# Patient Record
Sex: Male | Born: 1960 | Race: White | Hispanic: No | Marital: Married | Smoking: Former smoker
Health system: Southern US, Community
[De-identification: ages and names within clinical notes are randomized; demographics above are authoritative.]

## PROBLEM LIST (undated history)

## (undated) DIAGNOSIS — I429 Cardiomyopathy, unspecified: Secondary | ICD-10-CM

## (undated) DIAGNOSIS — K219 Gastro-esophageal reflux disease without esophagitis: Secondary | ICD-10-CM

## (undated) DIAGNOSIS — N289 Disorder of kidney and ureter, unspecified: Secondary | ICD-10-CM

## (undated) DIAGNOSIS — E669 Obesity, unspecified: Secondary | ICD-10-CM

## (undated) DIAGNOSIS — I4892 Unspecified atrial flutter: Secondary | ICD-10-CM

## (undated) HISTORY — PX: FRACTURE SURGERY: SHX138

## (undated) HISTORY — PX: TONSILLECTOMY: SUR1361

## (undated) HISTORY — PX: APPENDECTOMY: SHX54

## (undated) HISTORY — PX: ANKLE FRACTURE SURGERY: SHX122

---

## 1999-03-12 ENCOUNTER — Emergency Department (HOSPITAL_COMMUNITY): Admission: EM | Admit: 1999-03-12 | Discharge: 1999-03-12 | Payer: Self-pay | Admitting: Emergency Medicine

## 2007-04-20 ENCOUNTER — Emergency Department (HOSPITAL_COMMUNITY): Admission: EM | Admit: 2007-04-20 | Discharge: 2007-04-20 | Payer: Self-pay | Admitting: Emergency Medicine

## 2007-05-03 ENCOUNTER — Ambulatory Visit (HOSPITAL_BASED_OUTPATIENT_CLINIC_OR_DEPARTMENT_OTHER): Admission: RE | Admit: 2007-05-03 | Discharge: 2007-05-03 | Payer: Self-pay | Admitting: Orthopedic Surgery

## 2007-07-29 ENCOUNTER — Encounter: Admission: RE | Admit: 2007-07-29 | Discharge: 2007-10-12 | Payer: Self-pay | Admitting: Orthopedic Surgery

## 2008-04-10 IMAGING — CT CT EXTREM LOW W/O CM*R*
1 of 2 series · 1 of 14 positions shown, 2 images · non-contrast
Comparison: Right ankle x-rays earlier in the evening.

CLINICAL DATA: Right ankle fracture.

CT OF THE RIGHT LOWER EXTREMITY (ANKLE) -WITHOUT CONTRAST 04/20/2007:
TECHNIQUE: Multidetector CT imaging was performed according to the standard
protocol.  Sagittal and coronal plane reformatted images were reconstructed from
the axial CT data, and were also reviewed.

[Series 2: control scan 2.0 b60s · axial · 0.24mm/px · z∈[-172,-172]mm · 1 of 1 slices shown, 2 images]
[im 1/1  soft-tissue]
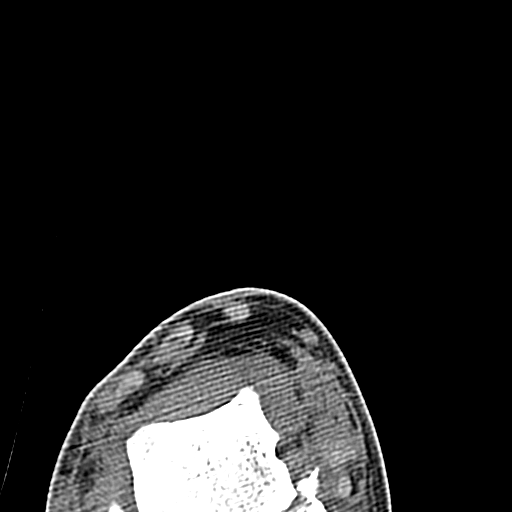
[im 1/1  bone]
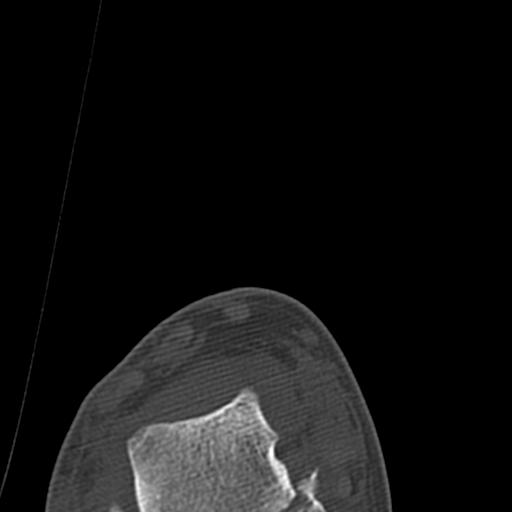

[1 of 14 positions shown; findings below may reference images not displayed]

FINDINGS: Comminuted fracture involving the posteromedial aspect of the talus
into multiple parts. Approximately 9 mm of distraction of the major posterior
fracture fragment from the remainder of the talus. Slight depression of the
posterolateral talar articular surface. No evidence of calcaneal fracture as
questioned on the plain films. Small plantar calcaneal spur noted. No fractures
involving the distal tibia or fibula. Tarsal navicular and cuboid bone also no
the intact without evidence of fracture.
IMPRESSION: Comminuted, multi-part fracture involving the posteromedial talus, with slight
depression of the posteromedial talar articular surface. No other fractures
about the ankle.

## 2008-04-10 IMAGING — CR DG ANKLE COMPLETE 3+V*R*
3 series · 3 of 3 positions shown · non-contrast
Comparison: none

CLINICAL DATA: 46 year-old male, right ankle injury. Pain and swelling.
RIGHT ANKLE ? 3 VIEW:

[t ankle joint ap right]
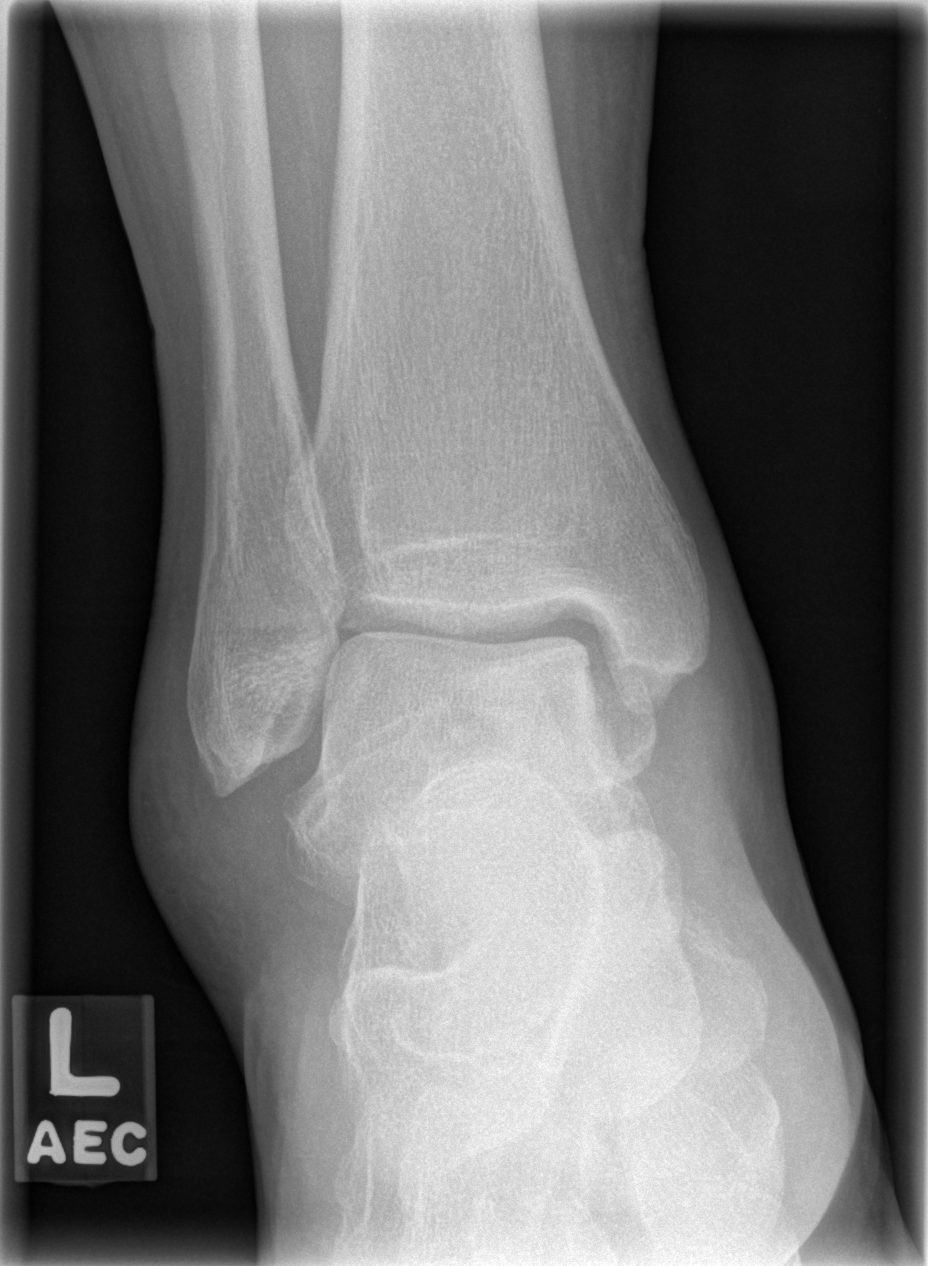

[t ankle joint oblique right]
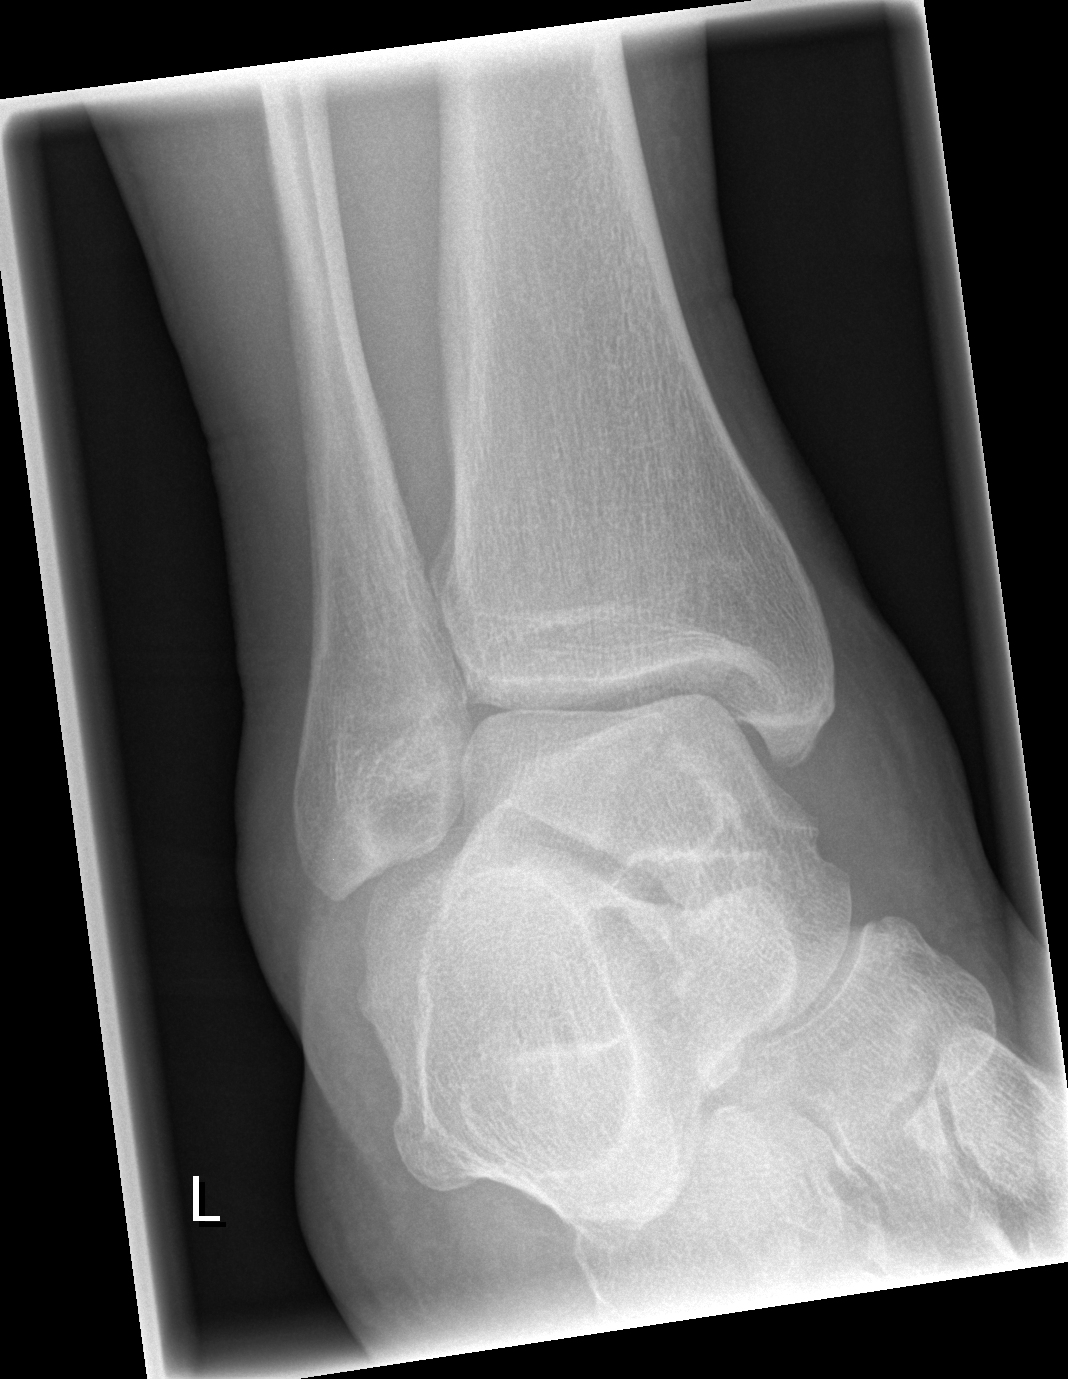

[t ankle joint lat right]
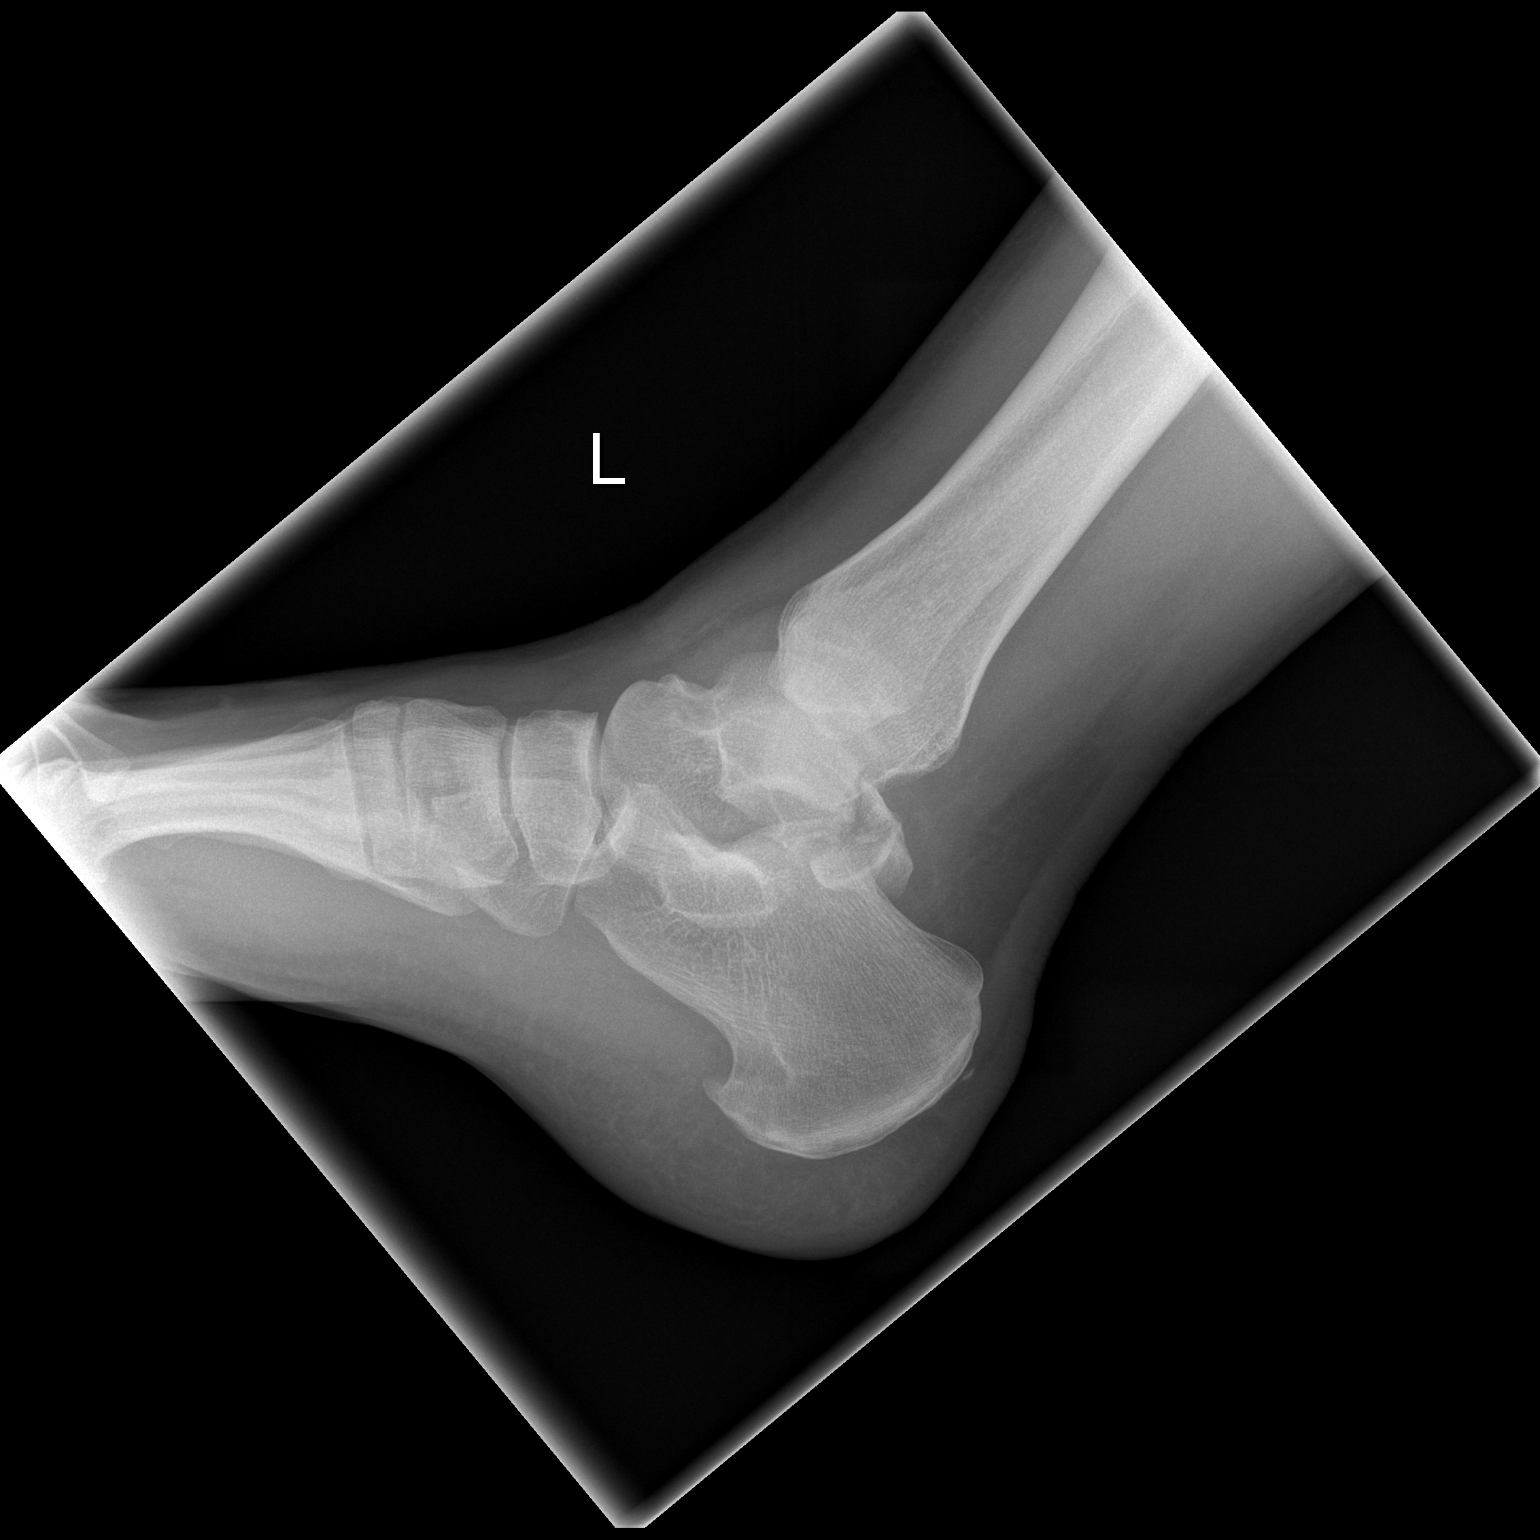

[3 of 3 positions shown; findings below may reference images not displayed]

FINDINGS: There is soft tissue swelling overlying the lateral malleolus without underlying fibular fracture. Lateral view demonstrates a comminuted posterior talar fracture. Joint effusion is present.
IMPRESSION: 1.  Comminuted posterior fracture of the talus. There may be a fracture of the calcaneus along the talocalcaneal joint.
2.  Moderate size joint effusion. 
The findings were called to Dr. Eduardo Faria.

## 2011-04-14 NOTE — Op Note (Signed)
NAME:  Dean Graves, Dean Graves NO.:  [id number]   MEDICAL RECORD NO.:  [id number]          PATIENT TYPE:  AMB   LOCATION:  DSC                          FACILITY:  [facility]   PHYSICIAN:  Leonides Grills, M.D.     DATE OF BIRTH:  [birth year]   DATE OF PROCEDURE:  05/03/2007  DATE OF DISCHARGE:                               OPERATIVE REPORT   PREOPERATIVE DIAGNOSIS:  Right talar body fracture.   POSTOPERATIVE DIAGNOSIS:  Right talar body fracture.   OPERATION:  1. Open reduction internal fixation, right talus fracture.  2. Right tarsal tunnel release.  3. Stress x-rays right ankle.   ANESTHESIA:  General.   SURGEON:  Leonides Grills,  MD   ASSISTANT:  Evlyn Kanner, P.A.   ESTIMATED BLOOD LOSS:  Minimal.   TOURNIQUET TIME:  Approximately an hour and a half.   COMPLICATIONS:  None.   DISPOSITION:  Stable to PR.   INDICATIONS:  A 50 year old male who slipped and fell off a ladder and  sustained the above injury.  He was consented for the above procedure.  All risks which include infection, nerve or vessel injury, nonunion,  malunion, hardware irritation, hardware failure, stiffness, arthritis,  persistent pain, worse pain, prolonged recovery, numbness of the plantar  aspect of his foot, specifically and vessel injury from the approach,  burning pain were all explained.  Questions were encouraged and  answered.   OPERATION:  The patient brought to the operating room, placed in supine  position initially.  After adequate general endotracheal tube anesthesia  administered as well as Ancef 1 gram IV piggyback the patient was then  placed in the later decubitus position with the operative side down on a  beanbag.  All bony prominences well padded, axillary roll was placed.  Right lower extremity was prepped and draped in sterile manner over a  proximally thigh tourniquet.  Limb was gravity exsanguinated.  Tourniquet elevated 290 mmHg.  Posterior medial approach to the  ankle  was then performed with a curvilinear incision.  Dissection was carried  down through skin.  Hemostasis was obtained.  A formal tarsal tunnel  release was then performed.  Once the flexor retinaculum over the tarsal  tunnel was released, the nerve and vessel were identified.  These were  retracted posteriorly out of harm's way.  FDL tendon was then identified  within the sheath and traced distally and again this was retracted with  the neurovascular structures as well.  FHL tendon was identified, traced  to the talus fracture posterior fracture fragment.  This was also a  tenolysis was performed of this tendon and its tendon tunnel was also  released as well.  Once both tunnels were released respectively for the  FDL and FHL, this helped [place] mobilize neurovascular structures and  protect neurovascular structures against these two tendons.  We were  able to visualize the fracture beautifully at this point.  There was a  lot of comminution in this area as well as several different  osteochondral lesions.  One from the subtalar area that needed to be  removed  was a free fragment and had no subchondral bone.  We used a  lamina spreader to book open the fracture and were able to debride any  loose fragments within the subtalar joint and ankle joint respectively.  We then anatomically reduced the fracture and then made a a small  incision just lateral to the Achilles tendon and blunt dissected to the  posterior aspect of the talus.  We then placed a threaded K-wire  provisionally in this area to hold the fracture reduced.  We then placed  another threaded K-wire protecting the neurovascular structures and  placed this medially as well.  We then placed two 3.5 mm partially  threaded cannulated screws over the wire drilling this in a lag fashion  with 3.5, 2.7 mm drill holes respectively.  This had excellent purchase  and compression of the fracture site.  Final stress x-rays were obtained   in the AP ankle, lateral ankle and the AP talar views and showed  anatomic reduction in all planes, fixation in proper position and  excellent as well.  We copiously irrigated the area with normal saline,  range-of-motion the ankle and talar joint without any impingement  mechanically.  It had excellent motion.  Tourniquet inflated.  Hemostasis was obtained.  There is no pulsatile bleeding and there was  excellent hemostasis.  Subcu was closed with 2-0 Vicryl.  Skin was  closed with 4-0 nylon over all wounds.  Sterile dressings were applied.  Modified Jones dressing was applied with ankle in neutral dorsiflexion.  The patient was stable to PR.   POSTOP COURSE:  The patient will follow-up in 2 weeks. At that time we  will remove the dressing as well as suture.  Will then place him in a  Cam walker boot nonweightbearing and start him on range of motion excise  for active passive range of motion excise the ankle and talar joint.  He  will remain nonweightbearing for total of 8 weeks and weight bear as  tolerated in the cam walker boot for additional month.  At 3 months  postop will likely have him go into a normal shoe.  Elevation active  range of motion toes were encouraged.      Leonides Grills, M.D.  Electronically Signed     PB/MEDQ  D:  05/03/2007  T:  05/03/2007  Job:  [id number]

## 2011-04-14 NOTE — Consult Note (Signed)
NAME:  CAUY, MELODY NO.:  [id number]   MEDICAL RECORD NO.:  [id number]          PATIENT TYPE:  EMS   LOCATION:  ED                           FACILITY:  [facility]   PHYSICIAN:  Erasmo Leventhal, M.D.DATE OF BIRTH:  Apr 09, 1961   DATE OF CONSULTATION:  04/20/2007  DATE OF DISCHARGE:  04/20/2007                                 CONSULTATION   TIME:  7:45 p.m.   HISTORY OF PRESENT ILLNESS:  Mr. Troung is a 50 year old male who fell  approximately 8 to 9 feet today off a ladder and sustained left ankle  pain.  He denies any problems with his back or right lower extremity.  He only complains of left foot and ankle pain.  He works as a Music therapist.  He presented to [facility] ED, was evaluated by the ED physician there  and diagnosed having a left talus fracture.  I was consulted and asked  to evaluate for CT scan done.   ALLERGIES:  SOME ANTIBIOTIC, he is not sure of the type.   MEDICATIONS:  None.   PAST MEDICAL HISTORY:  Borderline hypertension.   PAST SURGICAL HISTORY:  None.   FAMILY HISTORY:  Noncontributory.   SOCIAL HISTORY:  He is married.  No tobacco, no alcohol.   REVIEW OF SYSTEMS:  Musculoskeletal system otherwise negative.   PHYSICAL EXAMINATION:  A very pleasant gentleman accompanied by his  wife.  Upper extremities other than right shoulder slightly sore but  full range of motion and normal strength.  Left lower extremity  unremarkable.  Right lower extremity:  Hip and knee negative.  Foot and  ankle mild swelling.  Skin is intact.  Neurovascular examination is  intact.  Compartments are soft.  Pain with range of motion, active and  passive.   Plain x-rays reviewed and these reveal what appears to be a posterior  talus fracture possibly associated calcaneus fracture.  CT scan was very  revealing with a comminuted, displaced, posterior talus fracture.   IMPRESSION:  1. Comminuted posterior talus fracture on the right.  2. Strained right  shoulder.   RECOMMENDATIONS:  We have splinted him.  He is going home, elevate,  neurovascular checks, bathroom privileges only, touchdown weightbearing.  I will also discuss this with Dr. Lestine Box.  He has a copy of his CT scan  and x-rays.  I will discuss this with Dr. Lestine Box of the foot and ankle,  who is subsequently a specialist, for further definitive treatment.  All  questions encouraged and answered from the patient and his wife.           ______________________________  Erasmo Leventhal, M.D.     RAC/MEDQ  D:  04/20/2007  T:  04/21/2007  Job:  [id number]

## 2011-05-23 ENCOUNTER — Ambulatory Visit (HOSPITAL_COMMUNITY): Admission: EM | Admit: 2011-05-23 | Payer: Self-pay | Source: Ambulatory Visit | Admitting: Orthopedic Surgery

## 2011-08-04 ENCOUNTER — Emergency Department (HOSPITAL_COMMUNITY)
Admission: EM | Admit: 2011-08-04 | Discharge: 2011-08-04 | Disposition: A | Discharge status: Home / Self Care | Payer: Worker's Compensation | Attending: Emergency Medicine | Admitting: Emergency Medicine

## 2011-08-04 DIAGNOSIS — S60559A Superficial foreign body of unspecified hand, initial encounter: Secondary | ICD-10-CM | POA: Insufficient documentation

## 2011-08-04 DIAGNOSIS — W268XXA Contact with other sharp object(s), not elsewhere classified, initial encounter: Secondary | ICD-10-CM | POA: Insufficient documentation

## 2011-08-07 ENCOUNTER — Ambulatory Visit (HOSPITAL_COMMUNITY)
Admission: AD | Admit: 2011-08-07 | Discharge: 2011-08-07 | Disposition: A | Discharge status: Home / Self Care | Payer: Worker's Compensation | Source: Ambulatory Visit | Attending: Orthopedic Surgery | Admitting: Orthopedic Surgery

## 2011-08-07 DIAGNOSIS — X58XXXA Exposure to other specified factors, initial encounter: Secondary | ICD-10-CM | POA: Insufficient documentation

## 2011-08-07 DIAGNOSIS — Y999 Unspecified external cause status: Secondary | ICD-10-CM | POA: Insufficient documentation

## 2011-08-07 DIAGNOSIS — Y929 Unspecified place or not applicable: Secondary | ICD-10-CM | POA: Insufficient documentation

## 2011-08-07 DIAGNOSIS — S60559A Superficial foreign body of unspecified hand, initial encounter: Secondary | ICD-10-CM | POA: Insufficient documentation

## 2011-08-07 LAB — SURGICAL PCR SCREEN
MRSA, PCR: NEGATIVE
Staphylococcus aureus: NEGATIVE

## 2011-08-07 LAB — CBC
Hemoglobin: 14.3 g/dL (ref 13.0–17.0)
MCHC: 35.5 g/dL (ref 30.0–36.0)
RBC: 4.7 MIL/uL (ref 4.22–5.81)
WBC: 6.5 10*3/uL (ref 4.0–10.5)

## 2011-08-09 NOTE — Op Note (Signed)
NAME:  Dean Graves, Dean Graves NO.:  [id number]  MEDICAL RECORD NO.:  [id number]  LOCATION:  2852                         FACILITY:  [facility]  PHYSICIAN:  Betha Loa, MD        DATE OF BIRTH:  09/01/1961  DATE OF PROCEDURE:  08/07/2011 DATE OF DISCHARGE:                              OPERATIVE REPORT   PREOPERATIVE DIAGNOSIS:  Left hand foreign body.  POSTOPERATIVE DIAGNOSIS:  Left hand foreign body.  PROCEDURE:  Left hand irrigation, debridement, and removal of foreign body.  SURGEON:  Betha Loa, MD  ASSISTANCE:  None.  ANESTHESIA:  General.  IV FLUIDS:  Per anesthesia flow sheet.  ESTIMATED BLOOD LOSS:  Minimal.  COMPLICATIONS:  None.  SPECIMENS:  Cultures to micro.  TOURNIQUET TIME:  31 minutes.  DISPOSITION:  Stable to PACU.  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  He states 3 days ago he got a wood splinter into his left palm.  He was seen in an urgent care in the emergency department but no more of the splinter was able to be removed than what he could pull out initially.  He followed up with me in the office.  He had intact sensation and capillary refill in all fingertips.  He could flex and extend the IP joint of thumb.  FDP and FDS were intact to all fingers.  He felt that there was foreign body remaining in the hand.  On radiographs, a small amount of radiopaque foreign body could be seen.  I discussed to Mr. Doyon the nature of this condition.  We discussed going to the operating room for irrigation and debridement and removal of the foreign body.  Risks, benefits and alternatives of surgery were discussed including the risk of blood loss, infection, damage to nerves, vessels, tendons, ligaments, bone, failure of surgery, need for additional surgery, complications with wound healing, continued pain, retained foreign body, need for repeat irrigation and debridement, and removal of foreign body.   He voiced understanding of these risks and elected to proceed.  OPERATIVE COURSE:  After being identified preoperatively by myself, the patient and I agreed upon the procedure and site procedure.  The surgical site was marked.  The risks, benefits and alternatives of surgery were reviewed and wished to proceed.  Surgical consent has been signed.  He was transferred to the operating room and placed on the operating room table in supine position with left upper extremity on arm board. General anesthesia was induced by the anesthesiologist.  Left upper extremity was prepped and draped in normal sterile orthopedic fashion. A surgical pause was performed between surgeons, Anesthesia and operating room staff, and all were in agreement as to the patient, procedure, and site of procedure.  Tourniquet at the proximal aspect of the forearm was inflated 250 mmHg after examination of limb with an Esmarch bandage.  The wound was extended in a Brunner-type fashion proximally from the volar aspect of the index MP joint.  This was carried into subcutaneous tissues by spreading technique.  Care was taken to protect all neurovascular structures.  The foreign body was identified.  It was splintered at  its proximal portion.  It was freed up from the soft tissues and was able to be removed in its entirety.  The void was in the subcutaneous tissues volar to the tendons and neurovascular bundles.  The ulnar digital nerve to the index finger was identified and was intact.  It was deep to the foreign body.  The wound was copiously irrigated with 1000 mL of sterile saline.  A vessel loop drain and iodoform packing were placed in the wound.  The wound was left open for drainage.  Cultures had been taken after removal of the foreign body. The wound was injected with approximately 6 mL of 0.25% plain Marcaine to aid in postoperative analgesia.  The wound was then dressed with sterile Xeroform and 4 x 4s and wrapped  with Kerlix and a Coban dressing lightly.  Tourniquets was deflated to 31 minutes.  The operative drapes were broken down.  The fingertips were pink with brisk capillary refill after deflation of the tourniquet.  The patient was awoken from anesthesia safely.  He was transferred back to stretcher and taken to PACU in stable condition.  He will be discharged home tonight.  I will give him a prescription for Percocet 5/325 one to two p.o. q.6 h. p.r.n. pain dispense #40 and Bactrim DS 1 p.o. b.i.d. x7 days.     Betha Loa, MD     KK/MEDQ  D:  08/07/2011  T:  08/08/2011  Job:  [id number]  Electronically Signed by Betha Loa  on 08/09/2011 05:03:43 PM

## 2011-08-09 NOTE — H&P (Signed)
NAME:  Dean Graves, BOHLKEN NO.:  [id number]  MEDICAL RECORD NO.:  [id number]  LOCATION:  2852                         FACILITY:  [facility]  PHYSICIAN:  Betha Loa, MD        DATE OF BIRTH:  1961/03/24  DATE OF ADMISSION:  08/07/2011 DATE OF DISCHARGE:                             HISTORY & PHYSICAL   CHIEF COMPLAINT:  Left hand splinter.  HISTORY OF PRESENT ILLNESS:  [name] is a 50 year old male who 3 days ago was moving some wood and got a splinter in his left hand in the palm.  He went to Urgent Care and Emergency Department and a part of the splinter was able to be removed.  He feels there are some left.  He was placed on clindamycin.  He has had no fevers, chills, or night sweats. He followed up with me in the office.  ALLERGIES:  PENICILLIN causes hives.  PAST MEDICAL HISTORY:  None.  PAST SURGICAL HISTORY:  Left index, long, and ring fingertip table saw injury, appendectomy, and right ankle open reduction and internal fixation.  MEDICATIONS:  None.  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  He does not smoke and drinks alcohol occasionally.  FAMILY HISTORY:  Positive for heart disease, hypertension, and arthritis.  REVIEW OF SYSTEMS:  Thirteen-point review of systems is negative with the exception of eyeglasses.  PHYSICAL EXAMINATION:  GENERAL:  Alert and oriented x3, well developed, well nourished. HEAD:  Normocephalic, atraumatic. NECK:  Supple, full range of motion. CHEST:  Regular rhythm. LUNGS:  Clear to auscultation bilaterally. ABDOMEN:  Nontender, nondistended. EXTREMITIES:  Bilateral upper extremities are intact to light touch sensation and capillary refill in all fingertips.  He can flex and extend the IP joints of his thumbs and can cross his fingers.  The right upper extremity is  without wounds, without tenderness to palpation. Left upper extremity has a wound volar to the MP of the index finger on the left palm.  He  states he feels like there is something underneath the skin over the MP joint of the index and long finger.  There is mild swelling and reddening in this area.  FDP and FDS are intact to all fingers.  He has intact sensation and capillary refill in all fingertips.  RADIOGRAPHS:  AP, lateral, and oblique views of the hand show evidence of a potential foreign body, volar to the MP joints, in the index, long, and possibly ring finger.  No fractures or dislocations are noted.  ASSESSMENT AND PLAN:  Left hand retained foreign body.  I discussed with Mr. Fana the nature of this condition.  We discussed going to the operating room for irrigation and debridement of the wound and removal of the foreign body.  Risks, benefits, and alternative of surgery were discussed including the risks of blood loss, infection, damage to nerves, vessels, tendons, ligaments, and bone, failure of surgery, need for additional surgery, complications with wound healing, continued pain, continued infection, and retained foreign body.  He voiced understanding these risks and elected to proceed.     Betha Loa, MD     KK/MEDQ  D:  08/07/2011  T:  08/08/2011  Job:  [id number]  Electronically Signed by Betha Loa  on 08/09/2011 05:04:27 PM

## 2011-08-10 LAB — CULTURE, ROUTINE-ABSCESS

## 2011-08-12 LAB — ANAEROBIC CULTURE

## 2011-09-17 LAB — POCT HEMOGLOBIN-HEMACUE
Hemoglobin: 15.7
Operator id: 123881

## 2017-02-18 ENCOUNTER — Encounter (HOSPITAL_COMMUNITY): Payer: Self-pay

## 2017-02-18 ENCOUNTER — Inpatient Hospital Stay (HOSPITAL_COMMUNITY)
Admission: EM | Admit: 2017-02-18 | Discharge: 2017-02-20 | DRG: 274 | Disposition: A | Discharge status: Home / Self Care | Payer: Managed Care, Other (non HMO) | Attending: Cardiology | Admitting: Cardiology

## 2017-02-18 ENCOUNTER — Emergency Department (HOSPITAL_COMMUNITY): Payer: Managed Care, Other (non HMO)

## 2017-02-18 DIAGNOSIS — N289 Disorder of kidney and ureter, unspecified: Secondary | ICD-10-CM

## 2017-02-18 DIAGNOSIS — Z87891 Personal history of nicotine dependence: Secondary | ICD-10-CM | POA: Diagnosis not present

## 2017-02-18 DIAGNOSIS — Z880 Allergy status to penicillin: Secondary | ICD-10-CM

## 2017-02-18 DIAGNOSIS — I4892 Unspecified atrial flutter: Secondary | ICD-10-CM | POA: Diagnosis present

## 2017-02-18 DIAGNOSIS — I248 Other forms of acute ischemic heart disease: Secondary | ICD-10-CM | POA: Diagnosis present

## 2017-02-18 DIAGNOSIS — I959 Hypotension, unspecified: Secondary | ICD-10-CM | POA: Diagnosis present

## 2017-02-18 DIAGNOSIS — I483 Typical atrial flutter: Secondary | ICD-10-CM

## 2017-02-18 DIAGNOSIS — E669 Obesity, unspecified: Secondary | ICD-10-CM

## 2017-02-18 DIAGNOSIS — I429 Cardiomyopathy, unspecified: Secondary | ICD-10-CM | POA: Diagnosis present

## 2017-02-18 DIAGNOSIS — R0602 Shortness of breath: Secondary | ICD-10-CM

## 2017-02-18 DIAGNOSIS — R002 Palpitations: Secondary | ICD-10-CM | POA: Diagnosis present

## 2017-02-18 HISTORY — DX: Gastro-esophageal reflux disease without esophagitis: K21.9

## 2017-02-18 HISTORY — DX: Unspecified atrial flutter: I48.92

## 2017-02-18 HISTORY — DX: Disorder of kidney and ureter, unspecified: N28.9

## 2017-02-18 HISTORY — DX: Obesity, unspecified: E66.9

## 2017-02-18 HISTORY — DX: Cardiomyopathy, unspecified: I42.9

## 2017-02-18 LAB — BASIC METABOLIC PANEL
ANION GAP: 11 (ref 5–15)
BUN: 30 mg/dL — ABNORMAL HIGH (ref 6–20)
CHLORIDE: 103 mmol/L (ref 101–111)
CO2: 23 mmol/L (ref 22–32)
Calcium: 9 mg/dL (ref 8.9–10.3)
Creatinine, Ser: 1.4 mg/dL — ABNORMAL HIGH (ref 0.61–1.24)
GFR calc non Af Amer: 55 mL/min — ABNORMAL LOW (ref >60)
Glucose, Bld: 119 mg/dL — ABNORMAL HIGH (ref 65–99)
POTASSIUM: 4.1 mmol/L (ref 3.5–5.1)
SODIUM: 137 mmol/L (ref 135–145)

## 2017-02-18 LAB — TROPONIN I: Troponin I: 0.04 ng/mL (ref <0.03)

## 2017-02-18 LAB — CBC
HEMATOCRIT: 46.3 % (ref 39.0–52.0)
Hemoglobin: 16.2 g/dL (ref 13.0–17.0)
MCH: 30.9 pg (ref 26.0–34.0)
MCHC: 35 g/dL (ref 30.0–36.0)
MCV: 88.4 fL (ref 78.0–100.0)
Platelets: 187 10*3/uL (ref 150–400)
RBC: 5.24 MIL/uL (ref 4.22–5.81)
RDW: 12.6 % (ref 11.5–15.5)
WBC: 8.4 10*3/uL (ref 4.0–10.5)

## 2017-02-18 LAB — TSH: TSH: 2.587 u[IU]/mL (ref 0.350–4.500)

## 2017-02-18 LAB — I-STAT TROPONIN, ED: TROPONIN I, POC: 0.02 ng/mL (ref 0.00–0.08)

## 2017-02-18 LAB — BRAIN NATRIURETIC PEPTIDE: B NATRIURETIC PEPTIDE 5: 115 pg/mL — AB (ref 0.0–100.0)

## 2017-02-18 LAB — MAGNESIUM: Magnesium: 2 mg/dL (ref 1.7–2.4)

## 2017-02-18 MED ORDER — METOPROLOL TARTRATE 5 MG/5ML IV SOLN
5.0000 mg | Freq: Once | INTRAVENOUS | Status: AC
  Administered 2017-02-18: 5 mg via INTRAVENOUS
  Filled 2017-02-18: qty 5

## 2017-02-18 MED ORDER — DILTIAZEM LOAD VIA INFUSION
20.0000 mg | Freq: Once | INTRAVENOUS | Status: AC
  Administered 2017-02-18: 20 mg via INTRAVENOUS
  Filled 2017-02-18: qty 20

## 2017-02-18 MED ORDER — DILTIAZEM HCL-DEXTROSE 100-5 MG/100ML-% IV SOLN (PREMIX)
5.0000 mg/h | INTRAVENOUS | Status: DC
  Administered 2017-02-18: 5 mg/h via INTRAVENOUS
  Filled 2017-02-18 (×2): qty 100

## 2017-02-18 MED ORDER — SODIUM CHLORIDE 0.9 % IV SOLN
INTRAVENOUS | Status: DC
  Administered 2017-02-18: 21:00:00 via INTRAVENOUS

## 2017-02-18 MED ORDER — APIXABAN 5 MG PO TABS
5.0000 mg | ORAL_TABLET | Freq: Two times a day (BID) | ORAL | Status: DC
  Administered 2017-02-18 – 2017-02-20 (×4): 5 mg via ORAL
  Filled 2017-02-18 (×4): qty 1

## 2017-02-18 MED ORDER — ASPIRIN EC 325 MG PO TBEC: 325.0000 mg | DELAYED_RELEASE_TABLET | Freq: Once | ORAL | Status: DC

## 2017-02-18 MED ORDER — ACETAMINOPHEN 325 MG PO TABS: 650.0000 mg | ORAL_TABLET | ORAL | Status: DC | PRN

## 2017-02-18 MED ORDER — ONDANSETRON HCL 4 MG/2ML IJ SOLN: 4.0000 mg | Freq: Four times a day (QID) | INTRAMUSCULAR | Status: DC | PRN

## 2017-02-18 NOTE — Progress Notes (Signed)
Patient remains on Cardizem gtt @ 20 mg/hr with HR in 160's -163. Asymptomatic. Stated sl SOB with O2 sat 97% on R/A. Placed on 2L for comfort with good effect.The Cardiology Fellows ( Dr Clarnce FlockFudim) notified of patient's current condition. Order to give Metoprolol 5 mg IV once, slowly.

## 2017-02-18 NOTE — ED Notes (Signed)
Upon arrival to pt's room, diltiazem running at 20mg /hr, as per report given. Rate updated in MAR.

## 2017-02-18 NOTE — ED Notes (Signed)
Attempted to call report

## 2017-02-18 NOTE — ED Provider Notes (Signed)
[facility]-EMERGENCY DEPT Provider Note   CSN: [id number] Arrival date & time: 02/18/17  1508     History   Chief Complaint Chief Complaint  Patient presents with  . Shortness of Breath  . Atrial Flutter    HPI Dean Pokorski Graves is a 56 y.o. male who presents to the Emergency Department with complaints of rapid heart rate and shortness of breath that began last night. He also reports an episode of non-radiating, non-exertional chest tightness that began last night after he awoke from a nap, but resolved without treatment after approximately 45 minutes. No diaphoresis, N/V/D, abdominal pain, back pain, dysuria, HA, dizziness, or lightheadedness. He reports he spoke with his daughter, who works in vascular surgery, last night after the onset of symptoms who recommended he see his PCP today for evaluation and to take an aspirin this AM. He was sent to the ED following an evaluation by his PCP. No history of cardiovascular disease.   Family hx includes his mother who has a h/o of atrial fibrillation and CAD. PMH include erectile dysfunction. Medications include testosterone. Allergic to PCN and amoxicillin. No pertinent surgical history. His a former 2.5 ppd smoker for 15 years. Quit in 1995.   PCP Dr. Asencion Partridge   HPI  Past Medical History:  Diagnosis Date  . Atrial flutter (HCC)     There are no active problems to display for this patient.   Past Surgical History:  Procedure Laterality Date  . ANKLE FRACTURE SURGERY    . APPENDECTOMY    . tonsillectomy      Home Medications    Prior to Admission medications   Medication Sig Start Date End Date Taking? Authorizing Provider  testosterone cypionate (DEPOTESTOSTERONE CYPIONATE) 200 MG/ML injection Inject 100 mg into the muscle every 14 (fourteen) days. 11/20/16  Yes Historical Provider, MD    Family History Family History  Problem Relation Age of Onset  . CAD Mother   . Atrial fibrillation Mother   . Lymphoma Father      Social History Social History  Substance Use Topics  . Smoking status: Former Games developer  . Smokeless tobacco: Never Used  . Alcohol use Yes     Comment: 3 drinks per night     Allergies   Amoxicillin and Penicillins   Review of Systems Review of Systems  Constitutional: Positive for activity change.  Eyes: Negative for visual disturbance.  Respiratory: Positive for shortness of breath.   Cardiovascular: Positive for chest pain (resolved) and palpitations.  Gastrointestinal: Negative for abdominal pain, diarrhea, nausea and vomiting.  Genitourinary: Negative for dysuria.  Musculoskeletal: Negative for back pain.  Skin: Negative for color change.  Allergic/Immunologic: Negative for immunocompromised state.  Neurological: Negative for dizziness, syncope, weakness and light-headedness.  Psychiatric/Behavioral: Negative for confusion.     Physical Exam Updated Vital Signs BP 108/82   Pulse (!) 156   Resp 18   SpO2 92%   Physical Exam  Constitutional: He appears well-developed and well-nourished.  HENT:  Head: Normocephalic and atraumatic.  Eyes: Conjunctivae are normal.  Neck: Neck supple.  Cardiovascular: Regular rhythm and intact distal pulses.  Tachycardia present.  PMI is not displaced.  Exam reveals no friction rub.   No murmur heard. Pulmonary/Chest: Effort normal and breath sounds normal. No respiratory distress. He has no wheezes. He has no rales.  Abdominal: Soft. There is no tenderness.  Musculoskeletal: Normal range of motion. He exhibits no edema, tenderness or deformity.  Neurological: He is alert.  Skin: Skin  is warm and dry. Capillary refill takes less than 2 seconds.  Psychiatric: He has a normal mood and affect.  Nursing note and vitals reviewed.  ED Treatments / Results  Labs (all labs ordered are listed, but only abnormal results are displayed) Labs Reviewed  BASIC METABOLIC PANEL - Abnormal; Notable for the following:       Result Value    Glucose, Bld 119 (*)    BUN 30 (*)    Creatinine, Ser 1.40 (*)    GFR calc non Af Amer 55 (*)    All other components within normal limits  CBC  MAGNESIUM  BRAIN NATRIURETIC PEPTIDE  TSH  I-STAT TROPOININ, ED    EKG  EKG Interpretation  Date/Time:  Thursday February 18 2017 15:15:27 EDT Ventricular Rate:  165 PR Interval:    QRS Duration: 78 QT Interval:  236 QTC Calculation: 390 R Axis:   -53 Text Interpretation:  Atrial flutter with 2:1 A-V conduction Left anterior fascicular block Cannot rule out Inferior infarct (masked by fascicular block?) , age undetermined Cannot rule out Anterior infarct , age undetermined Abnormal ECG Confirmed by [facility] LL[facility]VIL[facility] MD, JULIE [phone]([id number]) on 02/18/2017 3:42:29 PM       Radiology Dg Chest Portable 1 View  Result Date: 02/18/2017 CLINICAL DATA:  Shortness of breath EXAM: PORTABLE CHEST 1 VIEW COMPARISON:  None. FINDINGS: The heart size and mediastinal contours are within normal limits. Both lungs are clear. The visualized skeletal structures are unremarkable. IMPRESSION: No active disease. Electronically Signed   By: Jasmine PangKim  Fujinaga M.D.   On: 02/18/2017 15:31    Procedures Procedures (including critical care time)  Medications Ordered in ED Medications  diltiazem (CARDIZEM) 1 mg/mL load via infusion 20 mg (20 mg Intravenous Bolus from Bag 02/18/17 1602)    And  diltiazem (CARDIZEM) 100 mg in dextrose 5% 100mL (1 mg/mL) infusion (10 mg/hr Intravenous Rate/Dose Change 02/18/17 1619)  metoprolol (LOPRESSOR) injection 5 mg (5 mg Intravenous Given 02/18/17 1626)     Initial Impression / Assessment and Plan / ED Course  I have reviewed the triage vital signs and the nursing notes.  Pertinent labs & imaging results that were available during my care of the patient were reviewed by me and considered in my medical decision making (see chart for details).     56 year old male with new onset atrial flutter as seen on EKG. No previous arrhthymias. Took ASA  325 PTA this AM. Electrolytes are within normal limits on BMP. Troponin negative. CXR negative. No additional risk factors for PE. Started on bolus and drip of diltiazem in the ED with no improvement in rate control. Given 5 mg of metoprolol with no improvement in rate control. Cardiology will admit and possibly cardiovert the patient. Low risk of thromboembolic event per CHADS2 score.   Final Clinical Impressions(s) / ED Diagnoses   Final diagnoses:  None    New Prescriptions New Prescriptions   No medications on file     Nicklas Mcsweeney Conan Bowensdair Sevana Grandinetti, PA-C 02/18/17 2140    Jacalyn LefevreJulie Haviland, MD 02/18/17 2154

## 2017-02-18 NOTE — ED Triage Notes (Signed)
Pt reports SOB that started last night. He went to his PCP today and was told he was in a-flutter, rate of 160. Pt denies chest pain but feels a rapid heart beat.

## 2017-02-18 NOTE — ED Provider Notes (Signed)
Generally healthy male here with chest pressure yesterday and heart palpitation and SOB today. Former smoker, denies any medication changes, no prior hx of aflutter/afib.  No other cardiac hx.  Does admits to drinking a glass of wine and a beer daily, last use last night.  No drug use. Found to have a-flutter with HR 160s.  Pt is hemodynamically stable.  Will start Diltiazem 20mg  bolus and a drip of 5mg /hr.  Work up initiated, care discussed with Dr. Particia NearingHaviland.   4:15 PM After receiving the initial Diltiazem bolus, no significant improvement of his HR.  Will give 5mg  of Lopressor and will transition to Diltiazem drip.    5:11 PM Pt has been evaluated by Cardiologist Dr. Jens Somrenshaw who plan to admit pt, likely cardiovert and TTE. He is currently hemodynamically stable, however HR maintains in the 150s.  Evidencen of AKI with BUN 30, Cr 1.40.  CXR unremarkable, normal trop.  Will continue with diltiazem drips.  Pt made NPO.  TSH has not resulted yet.  6:16 PM Cardiology plan for cardiac ablation tomorrow.    BP 107/87   Pulse (!) 152   Resp 13   Wt 102.5 kg Comment: pt-reported in ED  SpO2 97%    Results for orders placed or performed during the hospital encounter of 02/18/17  Basic metabolic panel  Result Value Ref Range   Sodium 137 135 - 145 mmol/L   Potassium 4.1 3.5 - 5.1 mmol/L   Chloride 103 101 - 111 mmol/L   CO2 23 22 - 32 mmol/L   Glucose, Bld 119 (H) 65 - 99 mg/dL   BUN 30 (H) 6 - 20 mg/dL   Creatinine, Ser 1.191.40 (H) 0.61 - 1.24 mg/dL   Calcium 9.0 8.9 - 14.710.3 mg/dL   GFR calc non Af Amer 55 (L) >60 mL/min   GFR calc Af Amer >60 >60 mL/min   Anion gap 11 5 - 15  CBC  Result Value Ref Range   WBC 8.4 4.0 - 10.5 K/uL   RBC 5.24 4.22 - 5.81 MIL/uL   Hemoglobin 16.2 13.0 - 17.0 g/dL   HCT 82.946.3 56.239.0 - 13.052.0 %   MCV 88.4 78.0 - 100.0 fL   MCH 30.9 26.0 - 34.0 pg   MCHC 35.0 30.0 - 36.0 g/dL   RDW 86.512.6 78.411.5 - 69.615.5 %   Platelets 187 150 - 400 K/uL  Brain natriuretic peptide   Result Value Ref Range   B Natriuretic Peptide 115.0 (H) 0.0 - 100.0 pg/mL  Magnesium  Result Value Ref Range   Magnesium 2.0 1.7 - 2.4 mg/dL  I-stat troponin, ED  Result Value Ref Range   Troponin i, poc 0.02 0.00 - 0.08 ng/mL   Comment 3           Dg Chest Portable 1 View  Result Date: 02/18/2017 CLINICAL DATA:  Shortness of breath EXAM: PORTABLE CHEST 1 VIEW COMPARISON:  None. FINDINGS: The heart size and mediastinal contours are within normal limits. Both lungs are clear. The visualized skeletal structures are unremarkable. IMPRESSION: No active disease. Electronically Signed   By: Jasmine PangKim  Fujinaga M.D.   On: 02/18/2017 15:31     CRITICAL CARE Performed by: Fayrene HelperRAN,Lorilyn Laitinen Total critical care time: 30 minutes Critical care time was exclusive of separately billable procedures and treating other patients. Critical care was necessary to treat or prevent imminent or life-threatening deterioration. Critical care was time spent personally by me on the following activities: development of treatment plan with patient and/or surrogate  as well as nursing, discussions with consultants, evaluation of patient's response to treatment, examination of patient, obtaining history from patient or surrogate, ordering and performing treatments and interventions, ordering and review of laboratory studies, ordering and review of radiographic studies, pulse oximetry and re-evaluation of patient's condition.    Fayrene Helper, PA-C 02/18/17 1817    Jacalyn Lefevre, MD 02/18/17 Rickey Primus

## 2017-02-18 NOTE — H&P (Signed)
Primary cardiologist: New  HPI: 56 year old male with no significant past medical history with new onset atrial flutter. Patient typically does not have dyspnea on exertion, orthopnea, PND, pedal edema, syncope or exertional chest pain. He has occasional brief skips but no sustained palpitations. At approximately 10 PM last evening he developed palpitations described as heart racing. There was associated chest tightness but no presyncope, syncope or dyspnea. His symptoms persisted and he presented to the emergency room this evening. He was found to be in atrial flutter and cardiology was asked to evaluate.   (Not in a hospital admission)  Allergies  Allergen Reactions  . Amoxicillin Rash  . Penicillins Rash    Past Medical History:  Diagnosis Date  . Atrial flutter [facility])     Past Surgical History:  Procedure Laterality Date  . ANKLE FRACTURE SURGERY    . APPENDECTOMY    . tonsillectomy      Social History   Social History  . Marital status: Married    Spouse name: N/A  . Number of children: 4  . Years of education: N/A   Occupational History  .      Carpenter   Social History Main Topics  . Smoking status: Former Research scientist (life sciences)  . Smokeless tobacco: Never Used  . Alcohol use Yes     Comment: 3 drinks per night  . Drug use: Unknown  . Sexual activity: Not on file   Other Topics Concern  . Not on file   Social History Narrative  . No narrative on file    Family History  Problem Relation Age of Onset  . CAD Mother   . Atrial fibrillation Mother   . Lymphoma Father     ROS:  no fevers or chills, productive cough, hemoptysis, dysphasia, odynophagia, melena, hematochezia, dysuria, hematuria, rash, seizure activity, orthopnea, PND, pedal edema, claudication. Remaining systems are negative.  Physical Exam:   Blood pressure 108/82, pulse (!) 156, resp. rate 18, SpO2 92 %.  General:  Well developed/well nourished in NAD Skin warm/dry Patient not depressed No  peripheral clubbing Back-normal HEENT-normal/normal eyelids Neck supple/normal carotid upstroke bilaterally; no bruits; no JVD; no thyromegaly chest - CTA/ normal expansion CV - irregular and tachycardic/normal S1 and S2; no murmurs, rubs or gallops;  PMI nondisplaced Abdomen -NT/ND, no HSM, no mass, + bowel sounds, no bruit 2+ femoral pulses, no bruits Ext-no edema, chords, 2+ DP Neuro-grossly nonfocal  ECG -typical atrial flutter personally reviewed  Results for orders placed or performed during the hospital encounter of 02/18/17 (from the past 48 hour(s))  Basic metabolic panel     Status: Abnormal   Collection Time: 02/18/17  3:20 PM  Result Value Ref Range   Sodium 137 135 - 145 mmol/L   Potassium 4.1 3.5 - 5.1 mmol/L   Chloride 103 101 - 111 mmol/L   CO2 23 22 - 32 mmol/L   Glucose, Bld 119 (H) 65 - 99 mg/dL   BUN 30 (H) 6 - 20 mg/dL   Creatinine, Ser 1.40 (H) 0.61 - 1.24 mg/dL   Calcium 9.0 8.9 - 10.3 mg/dL   GFR calc non Af Amer 55 (L) >60 mL/min   GFR calc Af Amer >60 >60 mL/min    Comment: (NOTE) The eGFR has been calculated using the CKD EPI equation. This calculation has not been validated in all clinical situations. eGFR's persistently <60 mL/min signify possible Chronic Kidney Disease.    Anion gap 11 5 - 15  CBC  Status: None   Collection Time: 02/18/17  3:20 PM  Result Value Ref Range   WBC 8.4 4.0 - 10.5 K/uL   RBC 5.24 4.22 - 5.81 MIL/uL   Hemoglobin 16.2 13.0 - 17.0 g/dL   HCT 46.3 39.0 - 52.0 %   MCV 88.4 78.0 - 100.0 fL   MCH 30.9 26.0 - 34.0 pg   MCHC 35.0 30.0 - 36.0 g/dL   RDW 12.6 11.5 - 15.5 %   Platelets 187 150 - 400 K/uL  Magnesium     Status: None   Collection Time: 02/18/17  3:20 PM  Result Value Ref Range   Magnesium 2.0 1.7 - 2.4 mg/dL  I-stat troponin, ED     Status: None   Collection Time: 02/18/17  3:25 PM  Result Value Ref Range   Troponin i, poc 0.02 0.00 - 0.08 ng/mL   Comment 3            Comment: Due to the release  kinetics of cTnI, a negative result within the first hours of the onset of symptoms does not rule out myocardial infarction with certainty. If myocardial infarction is still suspected, repeat the test at appropriate intervals.     Dg Chest Portable 1 View  Result Date: 02/18/2017 CLINICAL DATA:  Shortness of breath EXAM: PORTABLE CHEST 1 VIEW COMPARISON:  None. FINDINGS: The heart size and mediastinal contours are within normal limits. Both lungs are clear. The visualized skeletal structures are unremarkable. IMPRESSION: No active disease. Electronically Signed   By: Donavan Foil M.D.   On: 02/18/2017 15:31    Assessment/Plan 1 typical atrial flutter-by history this began at 10 PM last evening and is therefore less than 24 hours in duration. He does not have any embolic risk factors although his blood pressure has apparently been borderline in the past. I have discussed the patient with Dr. Curt Bears. I will admit to telemetry. We will continue with Cardizem for rate control. Check TSH. Check echocardiogram tomorrow morning. Add apixaban 5 mg BID. Plan to proceed with atrial flutter ablation tomorrow. He will not require a TEE as duration is less than 48 hours. Would then continue anticoagulation for 4 weeks and discontinue afterwards.   2 chest tightness-no ischemic changes and likely secondary to tachycardia. Cycle enzymes. Initial troponin normal.  3 renal insufficiency-no labs for comparison. BUN 30 and creatinine 1.40. Appears to be prerenal. Will hydrate and recheck tomorrow morning.  Kirk Ruths MD 02/18/2017, 5:25 PM

## 2017-02-19 ENCOUNTER — Encounter (HOSPITAL_COMMUNITY)
Admission: EM | Disposition: A | Discharge status: Home / Self Care | Payer: Self-pay | Source: Home / Self Care | Attending: Cardiology

## 2017-02-19 ENCOUNTER — Ambulatory Visit: Payer: Self-pay | Admitting: Cardiovascular Disease

## 2017-02-19 ENCOUNTER — Encounter (HOSPITAL_COMMUNITY): Payer: Self-pay | Admitting: *Deleted

## 2017-02-19 ENCOUNTER — Inpatient Hospital Stay (HOSPITAL_COMMUNITY): Payer: Managed Care, Other (non HMO) | Admitting: Anesthesiology

## 2017-02-19 ENCOUNTER — Inpatient Hospital Stay (HOSPITAL_COMMUNITY): Payer: Managed Care, Other (non HMO)

## 2017-02-19 DIAGNOSIS — I4892 Unspecified atrial flutter: Secondary | ICD-10-CM

## 2017-02-19 HISTORY — PX: A-FLUTTER ABLATION: EP1230

## 2017-02-19 HISTORY — PX: ATRIAL FLUTTER ABLATION: SHX5733

## 2017-02-19 LAB — BASIC METABOLIC PANEL
ANION GAP: 8 (ref 5–15)
BUN: 30 mg/dL — ABNORMAL HIGH (ref 6–20)
CALCIUM: 8.6 mg/dL — AB (ref 8.9–10.3)
CO2: 29 mmol/L (ref 22–32)
Chloride: 102 mmol/L (ref 101–111)
Creatinine, Ser: 1.38 mg/dL — ABNORMAL HIGH (ref 0.61–1.24)
GFR, EST NON AFRICAN AMERICAN: 56 mL/min — AB (ref >60)
Glucose, Bld: 110 mg/dL — ABNORMAL HIGH (ref 65–99)
Potassium: 4.6 mmol/L (ref 3.5–5.1)
Sodium: 139 mmol/L (ref 135–145)

## 2017-02-19 LAB — CBC
HCT: 44.1 % (ref 39.0–52.0)
HEMOGLOBIN: 14.9 g/dL (ref 13.0–17.0)
MCH: 29.9 pg (ref 26.0–34.0)
MCHC: 33.8 g/dL (ref 30.0–36.0)
MCV: 88.6 fL (ref 78.0–100.0)
PLATELETS: 163 10*3/uL (ref 150–400)
RBC: 4.98 MIL/uL (ref 4.22–5.81)
RDW: 12.5 % (ref 11.5–15.5)
WBC: 6.7 10*3/uL (ref 4.0–10.5)

## 2017-02-19 LAB — ECHOCARDIOGRAM COMPLETE
HEIGHTINCHES: 69 in
WEIGHTICAEL: 3465.6 [oz_av]

## 2017-02-19 LAB — HIV ANTIBODY (ROUTINE TESTING W REFLEX): HIV SCREEN 4TH GENERATION: NONREACTIVE

## 2017-02-19 LAB — TROPONIN I: TROPONIN I: 0.03 ng/mL — AB (ref <0.03)

## 2017-02-19 SURGERY — A-FLUTTER ABLATION
Anesthesia: Monitor Anesthesia Care

## 2017-02-19 MED ORDER — ONDANSETRON HCL 4 MG/2ML IJ SOLN: 4.0000 mg | Freq: Four times a day (QID) | INTRAMUSCULAR | Status: DC | PRN

## 2017-02-19 MED ORDER — ACETAMINOPHEN 325 MG PO TABS: 650.0000 mg | ORAL_TABLET | ORAL | Status: DC | PRN

## 2017-02-19 MED ORDER — HEPARIN (PORCINE) IN NACL 2-0.9 UNIT/ML-% IJ SOLN
INTRAMUSCULAR | Status: DC | PRN
  Administered 2017-02-19: 13:00:00

## 2017-02-19 MED ORDER — MIDAZOLAM HCL 5 MG/5ML IJ SOLN
INTRAMUSCULAR | Status: DC | PRN
  Administered 2017-02-19: 2 mg via INTRAVENOUS

## 2017-02-19 MED ORDER — BUPIVACAINE HCL (PF) 0.25 % IJ SOLN
INTRAMUSCULAR | Status: DC | PRN
  Administered 2017-02-19: 45 mL

## 2017-02-19 MED ORDER — SODIUM CHLORIDE 0.9 % IV SOLN: INTRAVENOUS | Status: DC

## 2017-02-19 MED ORDER — AMIODARONE LOAD VIA INFUSION
150.0000 mg | Freq: Once | INTRAVENOUS | Status: AC
  Administered 2017-02-19: 150 mg via INTRAVENOUS
  Filled 2017-02-19: qty 83.34

## 2017-02-19 MED ORDER — AMIODARONE HCL IN DEXTROSE 360-4.14 MG/200ML-% IV SOLN
INTRAVENOUS | Status: AC
  Filled 2017-02-19: qty 200

## 2017-02-19 MED ORDER — PROPOFOL 500 MG/50ML IV EMUL
INTRAVENOUS | Status: DC | PRN
  Administered 2017-02-19: 75 ug/kg/min via INTRAVENOUS

## 2017-02-19 MED ORDER — AMIODARONE HCL IN DEXTROSE 360-4.14 MG/200ML-% IV SOLN
30.0000 mg/h | INTRAVENOUS | Status: DC
  Administered 2017-02-19: 30 mg/h via INTRAVENOUS

## 2017-02-19 MED ORDER — FENTANYL CITRATE (PF) 100 MCG/2ML IJ SOLN
INTRAMUSCULAR | Status: DC | PRN
  Administered 2017-02-19: 100 ug via INTRAVENOUS
  Administered 2017-02-19 (×2): 25 ug via INTRAVENOUS

## 2017-02-19 MED ORDER — PERFLUTREN LIPID MICROSPHERE
INTRAVENOUS | Status: AC
  Filled 2017-02-19: qty 10

## 2017-02-19 MED ORDER — AMIODARONE HCL IN DEXTROSE 360-4.14 MG/200ML-% IV SOLN
INTRAVENOUS | Status: DC | PRN
Start: 2017-02-19 — End: 2017-02-19
  Administered 2017-02-19: 60 mg/h via INTRAVENOUS

## 2017-02-19 MED ORDER — SODIUM CHLORIDE 0.9 % IV SOLN: 250.0000 mL | INTRAVENOUS | Status: DC | PRN

## 2017-02-19 MED ORDER — PHENYLEPHRINE HCL 10 MG/ML IJ SOLN
INTRAVENOUS | Status: DC | PRN
  Administered 2017-02-19: 20 ug/min via INTRAVENOUS

## 2017-02-19 MED ORDER — PERFLUTREN LIPID MICROSPHERE
1.0000 mL | INTRAVENOUS | Status: AC | PRN
  Filled 2017-02-19: qty 10

## 2017-02-19 MED ORDER — BUPIVACAINE HCL (PF) 0.25 % IJ SOLN
INTRAMUSCULAR | Status: AC
  Filled 2017-02-19: qty 60

## 2017-02-19 MED ORDER — SODIUM CHLORIDE 0.9% FLUSH
3.0000 mL | INTRAVENOUS | Status: DC | PRN
Start: 2017-02-19 — End: 2017-02-20

## 2017-02-19 MED ORDER — LACTATED RINGERS IV SOLN
INTRAVENOUS | Status: DC | PRN
  Administered 2017-02-19: 11:00:00 via INTRAVENOUS

## 2017-02-19 MED ORDER — AMIODARONE HCL IN DEXTROSE 360-4.14 MG/200ML-% IV SOLN
60.0000 mg/h | INTRAVENOUS | Status: DC
  Administered 2017-02-19 (×2): 60 mg/h via INTRAVENOUS
  Filled 2017-02-19 (×2): qty 200

## 2017-02-19 MED ORDER — AMIODARONE HCL 200 MG PO TABS: 200.0000 mg | ORAL_TABLET | Freq: Two times a day (BID) | ORAL | Status: DC

## 2017-02-19 MED ORDER — HEPARIN (PORCINE) IN NACL 2-0.9 UNIT/ML-% IJ SOLN
INTRAMUSCULAR | Status: AC
  Filled 2017-02-19: qty 500

## 2017-02-19 MED ORDER — SODIUM CHLORIDE 0.9% FLUSH
3.0000 mL | Freq: Two times a day (BID) | INTRAVENOUS | Status: DC
  Administered 2017-02-19: 3 mL via INTRAVENOUS

## 2017-02-19 MED ORDER — AMIODARONE HCL 200 MG PO TABS: 200.0000 mg | ORAL_TABLET | Freq: Every day | ORAL | Status: DC

## 2017-02-19 SURGICAL SUPPLY — 16 items
BAG SNAP BAND KOVER 36X36 (MISCELLANEOUS) ×2 IMPLANT
BLANKET WARM UNDERBOD FULL ACC (MISCELLANEOUS) ×2 IMPLANT
CATH DUODECA HALO/ISMUS 7FR (CATHETERS) ×1 IMPLANT
CATH EZ STEER NAV 8MM D-F CUR (ABLATOR) ×1 IMPLANT
CATH JOSEPHSON QUAD-ALLRED 6FR (CATHETERS) ×1 IMPLANT
CATH WEBSTER BI DIR CS D-F CRV (CATHETERS) ×1 IMPLANT
COVER SWIFTLINK CONNECTOR (BAG) ×2 IMPLANT
PACK EP LATEX FREE (CUSTOM PROCEDURE TRAY) ×2
PACK EP LF (CUSTOM PROCEDURE TRAY) ×1 IMPLANT
PAD DEFIB LIFELINK (PAD) ×2 IMPLANT
PATCH CARTO3 (PAD) ×1 IMPLANT
SHEATH PINNACLE 6F 10CM (SHEATH) ×1 IMPLANT
SHEATH PINNACLE 7F 10CM (SHEATH) ×1 IMPLANT
SHEATH PINNACLE 8F 10CM (SHEATH) ×1 IMPLANT
SHEATH PINNACLE 9F 10CM (SHEATH) ×1 IMPLANT
SHEATH SWARTZ RAMP 8.5F 60CM (SHEATH) ×1 IMPLANT

## 2017-02-19 NOTE — Procedures (Signed)
Spoke with Luster [place], NP @ change of shift and gave her updates on patient as well as showed her V/S on monitor. V/S look better and patient denied any further deviation.

## 2017-02-19 NOTE — Anesthesia Procedure Notes (Signed)
Procedure Name: MAC Date/Time: 02/19/2017 11:50 AM Performed by: Izora Gala Pre-anesthesia Checklist: Patient identified, Emergency Drugs available, Patient being monitored and Suction available Patient Re-evaluated:Patient Re-evaluated prior to inductionOxygen Delivery Method: Simple face mask Preoxygenation: Pre-oxygenation with 100% oxygen Ventilation: Oral airway inserted - appropriate to patient size Placement Confirmation: ETT inserted through vocal cords under direct vision and positive ETCO2

## 2017-02-19 NOTE — Progress Notes (Signed)
TEE; indication-atrial flutter Pt sedated by anesthesia Omniplane probe passed without difficulty Mild to moderate LV dysfunction (EF 40-45) Moderate LAE; no LAA thrombus Mildly reduced RV function Trace MR, Mild TR Full report to follow Olga MillersBrian Chau Savell, MD

## 2017-02-19 NOTE — Anesthesia Preprocedure Evaluation (Addendum)
Anesthesia Evaluation  Patient identified by MRN, date of birth, ID band Patient awake    Reviewed: Allergy & Precautions, NPO status , Patient's Chart, lab work & pertinent test results  Airway Mallampati: I  TM Distance: >3 FB Neck ROM: Full    Dental no notable dental hx.    Pulmonary neg pulmonary ROS, former smoker,    breath sounds clear to auscultation       Cardiovascular + dysrhythmias Atrial Fibrillation + Valvular Problems/Murmurs  Rhythm:Irregular Rate:Tachycardia     Neuro/Psych negative neurological ROS  negative psych ROS   GI/Hepatic negative GI ROS, Neg liver ROS,   Endo/Other  negative endocrine ROS  Renal/GU negative Renal ROS  negative genitourinary   Musculoskeletal negative musculoskeletal ROS (+)   Abdominal   Peds negative pediatric ROS (+)  Hematology negative hematology ROS (+)   Anesthesia Other Findings   Reproductive/Obstetrics negative OB ROS                            Anesthesia Physical Anesthesia Plan  ASA: III  Anesthesia Plan: MAC   Post-op Pain Management:    Induction: Intravenous  Airway Management Planned: Natural Airway  Additional Equipment:   Intra-op Plan:   Post-operative Plan:   Informed Consent: I have reviewed the patients History and Physical, chart, labs and discussed the procedure including the risks, benefits and alternatives for the proposed anesthesia with the patient or authorized representative who has indicated his/her understanding and acceptance.   Dental advisory given  Plan Discussed with: CRNA  Anesthesia Plan Comments:        Anesthesia Quick Evaluation

## 2017-02-19 NOTE — Transfer of Care (Signed)
Immediate Anesthesia Transfer of Care Note  Patient: Stillman Buenger Hehir  Procedure(s) Performed: Procedure(s): A-Flutter Ablation (N/A)  Patient Location: PACU  Anesthesia Type:MAC  Level of Consciousness: awake, alert  and oriented  Airway & Oxygen Therapy: Patient Spontanous Breathing and Patient connected to nasal cannula oxygen  Post-op Assessment: Report given to RN, Post -op Vital signs reviewed and stable, Patient moving all extremities and Patient moving all extremities X 4  Post vital signs: Reviewed and stable  Last Vitals:  Vitals:   02/19/17 1445 02/19/17 1450  BP: 130/85 134/85  Pulse: 86 74  Resp: (!) 26 15  Temp:      Last Pain:  Vitals:   02/19/17 0835  TempSrc: Oral         Complications: No apparent anesthesia complications

## 2017-02-19 NOTE — Progress Notes (Signed)
  Echocardiogram Echocardiogram Transesophageal has been performed.  Arvil ChacoFoster, Julietta Batterman 02/19/2017, 1:00 PM

## 2017-02-19 NOTE — Progress Notes (Signed)
Bp dropping to 70's/40's-50's. Call placed to Dr Clarnce FlockFudim ( Cardizem gtt on hold for now). Order given to D/C Cardizem  gtt and start Amiodarone gtt approximately 15 minutes after. Bp currently in 90's/60's and patient ia asymptomatic

## 2017-02-19 NOTE — Progress Notes (Signed)
Site area: Right groin a 6,7, and 9 french venous sheath was removed  Site Prior to Removal:  Level 0  Pressure Applied For 20 MINUTES    Bedrest Beginning at 1500p  Manual:   Yes.    Patient Status During Pull:  stable  Post Pull Groin Site:  Level 0  Post Pull Instructions Given:  Yes.    Post Pull Pulses Present:  Yes.    Dressing Applied:  Yes.    Comments:  VS remain stable

## 2017-02-19 NOTE — Consult Note (Signed)
ELECTROPHYSIOLOGY CONSULT NOTE    Patient ID: Dean Graves MRN: [id number], DOB/AGE: 57-26-1962 57 y.o.  Admit date: 02/18/2017 Date of Consult: 02/19/2017   Primary Physician: PROVIDER NOT IN SYSTEM Primary Cardiologist: new Dr. Jens Som  Reason for Consultation: AFlutter RVR  HPI: Dean Graves is a 56 y.o. male with no know PMHx comes with new onset palpitations,, DOE.  He denies any CP, near syncope or syncope.  No recent illness, no unusual life or stressful events, no OTC or new medicines, he only takes testosterone replacement for low levels, this not new.  Outside of palpitations, he denies any CP, or rest SOB, + DOE, no near syncope or syncope.  LABS: K+ 4.6 Mag 2.0 BUN/Creat 3-/1.38 poc Trop 0.02 Trop I: 0.04, 0.03 WBC 6.7 H/H 14/44 plts 163 TSH 2.58  BP has been OK, a few lower readings 102/69 this morning Onset of palpitations between 21:00 and 22:00, 02/17/17, started on Eliquis yesterday 5mg , 1st dose 20:39 within 24 hours of onset of palpitations  Past Medical History:  Diagnosis Date  . Atrial flutter [facility])      Surgical History:  Past Surgical History:  Procedure Laterality Date  . ANKLE FRACTURE SURGERY    . APPENDECTOMY    . tonsillectomy       Prescriptions Prior to Admission  Medication Sig Dispense Refill Last Dose  . testosterone cypionate (DEPOTESTOSTERONE CYPIONATE) 200 MG/ML injection Inject 100 mg into the muscle every 14 (fourteen) days.  2 Past Month at Unknown time    Inpatient Medications:  . amiodarone  200 mg Oral Q12H   Followed by  . [START ON 02/26/2017] amiodarone  200 mg Oral Daily  . apixaban  5 mg Oral BID    Allergies:  Allergies  Allergen Reactions  . Amoxicillin Rash  . Penicillins Rash    Social History   Social History  . Marital status: Married    Spouse name: N/A  . Number of children: 4  . Years of education: N/A   Occupational History  .      Carpenter   Social History Main Topics  .  Smoking status: Former Games developer  . Smokeless tobacco: Never Used  . Alcohol use Yes     Comment: 3 drinks per night  . Drug use: Unknown  . Sexual activity: Not on file   Other Topics Concern  . Not on file   Social History Narrative  . No narrative on file     Family History  Problem Relation Age of Onset  . CAD Mother   . Atrial fibrillation Mother   . Lymphoma Father      Review of Systems: All other systems reviewed and are otherwise negative except as noted above.  Physical Exam: Vitals:   02/19/17 0100 02/19/17 0115 02/19/17 0130 02/19/17 0145  BP: 90/76 99/74 (!) 121/97 102/69  Pulse: (!) 145 (!) 146 (!) 145 (!) 144  Resp:      Temp:      TempSrc:      SpO2:      Weight:      Height:        GEN- The patient is well appearing, alert and oriented x 3 today.   HEENT: normocephalic, atraumatic; sclera clear, conjunctiva pink; hearing intact; oropharynx clear; neck supple, no JVP Lymph- no cervical lymphadenopathy Lungs- CTA b/l, normal work of breathing.  No wheezes, rales, rhonchi Heart- RRR, tachycardic, no murmurs, rubs or gallops, PMI not laterally displaced GI- soft,  non-tender, non-distended, bowel sounds present Extremities- no clubbing, cyanosis, or edema MS- no significant deformity or atrophy Skin- warm and dry, no rash or lesion Psych- euthymic mood, full affect Neuro- no gross deficits observed  Labs:   Lab Results  Component Value Date   WBC 6.7 02/19/2017   HGB 14.9 02/19/2017   HCT 44.1 02/19/2017   MCV 88.6 02/19/2017   PLT 163 02/19/2017    Recent Labs Lab 02/19/17 0243  NA 139  K 4.6  CL 102  CO2 29  BUN 30*  CREATININE 1.38*  CALCIUM 8.6*  GLUCOSE 110*      Radiology/Studies:  Dg Chest Portable 1 View Result Date: 02/18/2017 CLINICAL DATA:  Shortness of breath EXAM: PORTABLE CHEST 1 VIEW COMPARISON:  None. FINDINGS: The heart size and mediastinal contours are within normal limits. Both lungs are clear. The visualized  skeletal structures are unremarkable. IMPRESSION: No active disease. Electronically Signed   By: Jasmine PangKim  Fujinaga M.D.   On: 02/18/2017 15:31    Reviewed by myself: EKG: AFlutter, 165bpm TELEMETRY: AFlutter, 140's TTE: ordered, pending   Assessment and Plan:   1. New onset AFlutter w/RVR     Appears typical     Started on Eliquis within 24 hours onset of palpitations  Dr. Elberta Fortisamnitz has seen and discussed with the patient/family EPS/ablation risks/benefits      CHA2DS2Vasc is zero, Tazaria Dlugosz keep full a/c peri-procedure, and at least 4 weeks post procedure. Stop amiodarone,  onset of arrhythmia not quite as clear as though initially, today he remarks he had felt unusually fatigued through the day Wed at least.  Dr. Jens Somrenshaw has discussed TEE, risks/benefits of TEE  Muzammil Bruins proceed with TEE prior to his EPS/ablation   Signed, Francis DowseRenee Ursuy, PA-C 02/19/2017 7:10 AM  I have seen and examined this patient with Francis Dowseenee Ursuy.  Agree with above, note added to reflect my findings.  On exam, tachycardic rhythm, no murmurs, lungs clear.  Presented to the hospital yesterday with palpitations, found to be in atrial flutter. Unclear as to the duration of the flutter. Plan for ablation of atrial flutter today with TEE by Dr. Jens Somrenshaw prior to the ablation. Risks and benefits discussed. Risks include but not limited to bleeding, tamponade, heart block, and stroke. The patient understands the risks and has agreed to the procedure.  Yordan Martindale M. Lyrik Dockstader MD 02/19/2017 8:20 AM

## 2017-02-19 NOTE — Progress Notes (Signed)
Patient's BP now sustaining in the 90's/70's. Amiodarone bolus of 150 mg completed and  Amiodarone 60 mg/hr (33.3 cc/hr) now infusing continuously. Monitering V/S closely.

## 2017-02-20 ENCOUNTER — Encounter (HOSPITAL_COMMUNITY): Payer: Self-pay | Admitting: Physician Assistant

## 2017-02-20 DIAGNOSIS — I248 Other forms of acute ischemic heart disease: Secondary | ICD-10-CM

## 2017-02-20 DIAGNOSIS — I429 Cardiomyopathy, unspecified: Secondary | ICD-10-CM

## 2017-02-20 DIAGNOSIS — N289 Disorder of kidney and ureter, unspecified: Secondary | ICD-10-CM

## 2017-02-20 DIAGNOSIS — E669 Obesity, unspecified: Secondary | ICD-10-CM

## 2017-02-20 MED ORDER — APIXABAN 5 MG PO TABS: 5.0000 mg | ORAL_TABLET | Freq: Two times a day (BID) | ORAL | 0 refills | Status: DC

## 2017-02-20 MED ORDER — METOPROLOL SUCCINATE ER 25 MG PO TB24: 25.0000 mg | ORAL_TABLET | Freq: Every day | ORAL | 3 refills | Status: DC

## 2017-02-20 MED ORDER — METOPROLOL SUCCINATE ER 25 MG PO TB24
25.0000 mg | ORAL_TABLET | Freq: Every day | ORAL | Status: DC
  Administered 2017-02-20: 25 mg via ORAL
  Filled 2017-02-20: qty 1

## 2017-02-20 NOTE — Plan of Care (Signed)
Problem: Education: Goal: Knowledge of [facility] Education information/materials will improve Outcome: Progressing Patient aware of plan of care.  Patient has denied pain this shift.  RN provided medication on medication that was administered this shift.

## 2017-02-20 NOTE — Discharge Summary (Signed)
Discharge Summary    Patient ID: Dean Graves,  MRN: [id number], DOB/AGE: Apr 20, 1961 56 y.o.  Admit date: 02/18/2017 Discharge date: 02/20/2017  Primary Care Provider: ANDY,CAMILLE L Primary Cardiologist: Dr. Leroy Searenshaw/Korrie Hofbauer  Discharge Diagnoses    Active Problems:   Atrial flutter (HCC)   Cardiomyopathy (HCC)   Demand ischemia (HCC)   Renal insufficiency    Diagnostic Studies/Procedures    1. Atrial flutter ablation this admission 2. TEE 02/19/17 - Left ventricle: Systolic function was mildly to moderately   reduced. The estimated ejection fraction was in the range of 40%   to 45%. Diffuse hypokinesis. - Aortic valve: No evidence of vegetation. - Mitral valve: No evidence of vegetation. - Left atrium: The atrium was moderately dilated. No evidence of   thrombus in the atrial cavity or appendage. - Right ventricle: Systolic function was mildly reduced. - Right atrium: No evidence of thrombus in the atrial cavity or   appendage. - Atrial septum: No defect or patent foramen ovale was identified. - Tricuspid valve: No evidence of vegetation. - Pulmonic valve: No evidence of vegetation. Impressions: - Mild to moderate global reduction in LV function; moderate LAE;   no LAA thrombus; trace MR; mild TR; patient in atrial flutter   with rapid ventricular response at time of procedure.  3. 2D Echo 02/19/17 Study Conclusions  - Left ventricle: The cavity size was normal. There was moderate   concentric hypertrophy. Systolic function was moderately reduced.   The estimated ejection fraction was in the range of 35% to 40%.   Diffuse hypokinesis. Doppler parameters are consistent with   abnormal left ventricular relaxation (grade 1 diastolic   dysfunction). - Aortic valve: There was no regurgitation. - Mitral valve: There was mild regurgitation. - Right ventricle: Systolic function was moderately reduced. - Tricuspid valve: There was mild regurgitation. - Pulmonary  arteries: Systolic pressure was within the normal   range. - Inferior vena cava: The vessel was normal in size. - Pericardium, extracardiac: There was no pericardial effusion.  Impressions:  - There is moderate decrease in systolic function of both left and   right ventricle. This is possibly secondary to atrial flutter   with RVR. Ventricular rate during study acquisition was 143 BPM. _____________     History of Present Illness     Dean Graves is a 56 y.o. male with history of obesity and no significant PMH who [facility] with new onset atrial flutter. Around 10pm the night prior to admission he developed tachypalpitations. Upon arrival to the ED he was noted to be in atrial flutter in the 150s and was admitted for further management. The following issues were addressed.  Hospital Course    1. Atrial flutter - he was apixaban and diltiazem. However, he developed hypotension and was subsequently started on amiodarone. TSH was normal. Lytes were normal. 2D echo showed EF 35-40%, grade 1 DD. He was seen by EP and felt to be a good candidate for atrial flutter ablation. TEE 02/19/17 showed mild-moderate LV Dysfunction with EF 40-45%, moderate LAE, mildly reduced RV function, no LAA thrombus, trace MR, mild TR. He subsequently underwent radiofrequency ablation of isthmus-dependent counter clockwise right atrial flutter. TSH wnl. He Dyamond Tolosa remain on Eliquis for 1 month - received 30 day handwritten rx/free card.  2. New cardiomyopathy - EF decreased as above, may have been due to atrial flutter during acquisition of echoes. ACEI not started due to #4, can consider as OP. Toprol 25mg  daily was started  since hypotension resolved after above procedure. Per Dr .Elberta Fortis Prachi Oftedahl need to consider timing of repeat echo to reassess LVEF. He had no signs of CHF this admission. Received CHF precautions on AVS.  3. Chest tightness - troponins 0.02-0.04-0.03-<0.03. This was felt demand due to his  tachycardia.  4. Renal insufficiency with BUN 30/Cr 1.40 - appeared pre-renal due to tachycardia. He was hydrated with follow-up Cr 1.38. Chronicity unclear. He was asked to f/u PCP for further monitoring/evaluation of this.  Dr. Elberta Fortis has seen and examined the patient today and feels he is stable for discharge. He advised no driving til Wednesday 4/09, may return to work on Monday 4/2. See below for dc instructions given to patient.   Consultants: EP _____________  Discharge Vitals Blood pressure 132/77, pulse 86, temperature 98.1 F (36.7 C), temperature source Oral, resp. rate 18, height 5\' 9"  (1.753 m), weight 221 lb 11.2 oz (100.6 kg), SpO2 96 %.  Filed Weights   02/18/17 2027 02/19/17 0737 02/20/17 0450  Weight: 216 lb 9.6 oz (98.2 kg) 220 lb 11.2 oz (100.1 kg) 221 lb 11.2 oz (100.6 kg)   General: Well developed, well nourished WM, in no acute distress. Head: Normocephalic, atraumatic, sclera non-icteric, no xanthomas, nares are without discharge. Neck: Negative for carotid bruits. JVP not elevated. Lungs: Clear bilaterally to auscultation without wheezes, rales, or rhonchi. Breathing is unlabored. Heart: RRR S1 S2 without murmurs, rubs, or gallops.  Abdomen: Soft, non-tender, non-distended with normoactive bowel sounds. No rebound/guarding. Extremities: No clubbing or cyanosis. No edema. Distal pedal pulses are 2+ and equal bilaterally. Right groin procedure site without hematoma, ecchymosis, or bruit. Neuro: Alert and oriented X 3. Moves all extremities spontaneously. Psych:  Responds to questions appropriately with a normal affect.  Labs & Radiologic Studies    CBC  Recent Labs  02/18/17 1520 02/19/17 0243  WBC 8.4 6.7  HGB 16.2 14.9  HCT 46.3 44.1  MCV 88.4 88.6  PLT 187 163   Basic Metabolic Panel  Recent Labs  02/18/17 1520 02/19/17 0243  NA 137 139  K 4.1 4.6  CL 103 102  CO2 23 29  GLUCOSE 119* 110*  BUN 30* 30*  CREATININE 1.40* 1.38*  CALCIUM 9.0  8.6*  MG 2.0  --    Cardiac Enzymes  Recent Labs  02/18/17 2100 02/19/17 0243 02/19/17 1005  TROPONINI 0.04* 0.03* <0.03   Thyroid Function Tests  Recent Labs  02/18/17 1619  TSH 2.587   _____________  Dg Chest Portable 1 View  Result Date: 02/18/2017 CLINICAL DATA:  Shortness of breath EXAM: PORTABLE CHEST 1 VIEW COMPARISON:  None. FINDINGS: The heart size and mediastinal contours are within normal limits. Both lungs are clear. The visualized skeletal structures are unremarkable. IMPRESSION: No active disease. Electronically Signed   By: Jasmine Pang M.D.   On: 02/18/2017 15:31   Disposition   Pt is being discharged home today in good condition.  Follow-up Plans & Appointments    Follow-up Information    Tionne Dayhoff Jorja Loa, MD Follow up on 03/22/2017.   Specialty:  Cardiology Why:  4:00PM Contact information: [address] [place] [address] [phone]          Discharge Instructions    Diet - low sodium heart healthy    Complete by:  As directed    Increase activity slowly    Complete by:  As directed    Your kidney function was mildly elevated this admission. We do not have  prior labs to compare to. This may be due to your elevated heart rate but needs to be further monitored by your primary care provider. Please schedule follow-up appointment.  You Bridgit Eynon remain on Eliquis for 1 month. You were also started on metoprolol.  No driving until Wednesday 1/61/09. No lifting over 5 lbs for 1 week. No sexual activity for 1 week. You may return to work on 03/01/17. Keep procedure site clean & dry. If you notice increased pain, swelling, bleeding or pus, call/return!  You may shower, but no soaking baths/hot tubs/pools for 1 week.   One of your heart tests showed weakness of the heart muscle this admission. This may make you more susceptible to weight gain from fluid retention, which can lead to symptoms that we call heart failure. Please follow these  special instructions:  1. Follow a low-salt diet and watch your fluid intake. In general, you should not be taking in more than 2 liters of fluid per day (no more than 8 glasses per day). Some patients are restricted to less than 1.5 liters of fluid per day (no more than 6 glasses per day). This includes sources of water in foods like soup, coffee, tea, milk, etc. 2. Weigh yourself on the same scale at same time of day and keep a log. 3. Call your doctor: (Anytime you feel any of the following symptoms)  - 3-4 pound weight gain in 1-2 days or 2 pounds overnight  - Shortness of breath, with or without a dry hacking cough  - Swelling in the hands, feet or stomach  - If you have to sleep on extra pillows at night in order to breathe  IT IS IMPORTANT TO LET YOUR DOCTOR KNOW EARLY ON IF YOU ARE HAVING SYMPTOMS SO WE CAN HELP YOU!      Discharge Medications   Allergies as of 02/20/2017      Reactions   Amoxicillin Rash   Penicillins Rash      Medication List    TAKE these medications   apixaban 5 MG Tabs tablet Commonly known as:  ELIQUIS Take 1 tablet (5 mg total) by mouth 2 (two) times daily.   metoprolol succinate 25 MG 24 hr tablet Commonly known as:  TOPROL-XL Take 1 tablet (25 mg total) by mouth daily.   testosterone cypionate 200 MG/ML injection Commonly known as:  DEPOTESTOSTERONE CYPIONATE Inject 100 mg into the muscle every 14 (fourteen) days.        Allergies:  Allergies  Allergen Reactions  . Amoxicillin Rash  . Penicillins Rash    Outstanding Labs/Studies   n/a  Duration of Discharge Encounter   Greater than 30 minutes including physician time.  Signed, Tacey Ruiz Dunn PA-C 02/20/2017, 9:22 AM  I have seen and examined this patient with Ronie Spies.  Agree with above, note added to reflect my findings.  On exam, regular rhythm, no murmurs, lungs clear. The hospital with palpitations, found to be in atrial flutter. TEE showed an EF of 40-45%, and no left  atrial appendage thrombus. He was found to have an elevated creatinine likely due to his tachycardia. Underwent atrial flutter ablation with resumption of sinus rhythm. Plan for discharge home today.    Reather Steller M. Wilberta Dorvil MD 02/20/2017 9:43 AM

## 2017-02-22 ENCOUNTER — Encounter (HOSPITAL_COMMUNITY): Payer: Self-pay | Admitting: Cardiology

## 2017-02-24 NOTE — Anesthesia Postprocedure Evaluation (Addendum)
Anesthesia Post Note  Patient: Dean Graves  Procedure(s) Performed: Procedure(s) (LRB): A-Flutter Ablation (N/A)  Patient location during evaluation: Endoscopy Anesthesia Type: MAC Level of consciousness: awake and alert Pain management: pain level controlled Vital Signs Assessment: post-procedure vital signs reviewed and stable Respiratory status: spontaneous breathing, nonlabored ventilation, respiratory function stable and patient connected to nasal cannula oxygen Cardiovascular status: stable and blood pressure returned to baseline Anesthetic complications: no       Last Vitals:  Vitals:   02/19/17 2105 02/20/17 0450  BP: 120/73 132/77  Pulse: 96 86  Resp:  18  Temp:  36.7 C    Last Pain:  Vitals:   02/20/17 0807  TempSrc:   PainSc: 0-No pain                 Morayma Godown,JAMES TERRILL

## 2017-03-04 ENCOUNTER — Encounter: Payer: Self-pay | Admitting: Cardiology

## 2017-03-22 ENCOUNTER — Ambulatory Visit (INDEPENDENT_AMBULATORY_CARE_PROVIDER_SITE_OTHER): Payer: Managed Care, Other (non HMO) | Admitting: Cardiology

## 2017-03-22 ENCOUNTER — Encounter: Payer: Self-pay | Admitting: Cardiology

## 2017-03-22 VITALS — BP 134/90 | HR 81 | Ht 69.0 in | Wt 228.9 lb

## 2017-03-22 DIAGNOSIS — I483 Typical atrial flutter: Secondary | ICD-10-CM | POA: Diagnosis not present

## 2017-03-22 NOTE — Patient Instructions (Signed)
Medication Instructions:    Your physician recommends that you continue on your current medications as directed. Please refer to the Current Medication list given to you today.  Labwork:  None ordered  Testing/Procedures: Your physician has requested that you have an echocardiogram in 5 months (before your follow up with cardiologist). Echocardiography is a painless test that uses sound waves to create images of your heart. It provides your doctor with information about the size and shape of your heart and how well your heart's chambers and valves are working. This procedure takes approximately one hour. There are no restrictions for this procedure.  Follow-Up:  Your physician wants you to follow-up in: 5 months with Dr. Elberta Fortis (after echocardiogram is completed).  You will receive a reminder letter in the mail two months in advance. If you don't receive a letter, please call our office to schedule the follow-up appointment.  - If you need a refill on your cardiac medications before your next appointment, please call your pharmacy.   Thank you for choosing [facility]!!   Dory Horn, RN [phone]

## 2017-03-22 NOTE — Progress Notes (Signed)
Electrophysiology Office Note   Date:  03/22/2017   ID:  Dean Graves, DOB Apr 03, 1961, MRN [id number]  PCP:  Willow Ora, MD  Cardiologist:  Jens Som Primary Electrophysiologist:  Sofija Antwi Jorja Loa, MD    Chief Complaint  Patient presents with  . Follow-up    AFlutter     History of Present Illness: Dean Graves is a 56 y.o. male who is being seen today for the evaluation of atrial flutter at the request of No ref. provider found. Presenting today for electrophysiology evaluation. He presented to the hospital on 02/19/17 with new onset palpitations. He was found to have atrial flutter. He had ablation for atrial flutter on 02/19/17. A TEE was done prior to ablation that showed an EF of 40-45% without left atrial appendage thrombus.    Today, he denies symptoms of palpitations, chest pain, shortness of breath, orthopnea, PND, lower extremity edema, claudication, dizziness, presyncope, syncope, bleeding, or neurologic sequela. The patient is tolerating medications without difficulties.    Past Medical History:  Diagnosis Date  . Atrial flutter (HCC)    a. s/p ablation 01/2017.  . Cardiomyopathy (HCC)    a. Dx 01/2017 in setting of atrial flutter - EF 40-45%.  Marland Kitchen GERD (gastroesophageal reflux disease)   . Obesity   . Renal insufficiency    Chronicity unknown, Cr 1.4 in 01/2017   Past Surgical History:  Procedure Laterality Date  . A-FLUTTER ABLATION N/A 02/19/2017   Procedure: A-Flutter Ablation;  Surgeon: Dula Havlik Jorja Loa, MD;  Location: [facility];  Service: Cardiovascular;  Laterality: N/A;  . ANKLE FRACTURE SURGERY Right   . APPENDECTOMY    . ATRIAL FLUTTER ABLATION  02/19/2017  . FRACTURE SURGERY    . TONSILLECTOMY       Current Outpatient Prescriptions  Medication Sig Dispense Refill  . apixaban (ELIQUIS) 5 MG TABS tablet Take 1 tablet (5 mg total) by mouth 2 (two) times daily. 60 tablet 0  . metoprolol succinate (TOPROL-XL) 25 MG 24 hr tablet Take  1 tablet (25 mg total) by mouth daily. 30 tablet 3  . Omega-3 Fatty Acids (FISH OIL) 500 MG CAPS Take 2 capsules by mouth daily.    Marland Kitchen testosterone cypionate (DEPOTESTOSTERONE CYPIONATE) 200 MG/ML injection Inject 100 mg into the muscle every 14 (fourteen) days.  2   No current facility-administered medications for this visit.     Allergies:   Amoxicillin and Penicillins   Social History:  The patient  reports that he has quit smoking. His smoking use included Cigarettes. He has a 30.00 pack-year smoking history. He has never used smokeless tobacco. He reports that he drinks about 12.6 oz of alcohol per week . He reports that he does not use drugs.   Family History:  The patient's family history includes Atrial fibrillation in his mother; CAD in his mother; Lymphoma in his father.    ROS:  Please see the history of present illness.   Otherwise, review of systems is positive for none.   All other systems are reviewed and negative.    PHYSICAL EXAM: VS:  BP 134/90   Pulse 81   Ht  (1.753 m)   Wt 228 lb 14.4 oz (103.8 kg)   BMI 33.80 kg/m  , BMI Body mass index is 33.8 kg/m. GEN: Well nourished, well developed, in no acute distress  HEENT: normal  Neck: no JVD, carotid bruits, or masses Cardiac: RRR; no murmurs, rubs, or gallops,no edema  Respiratory:  clear  to auscultation bilaterally, normal work of breathing GI: soft, nontender, nondistended, + BS MS: no deformity or atrophy  Skin: warm and dry Neuro:  Strength and sensation are intact Psych: euthymic mood, full affect  EKG:  EKG is ordered today. Personal review of the ekg ordered shows sinus rhythm, LAD, cannot rule out IMI  Recent Labs: 02/18/2017: B Natriuretic Peptide 115.0; Magnesium 2.0; TSH 2.587 02/19/2017: BUN 30; Creatinine, Ser 1.38; Hemoglobin 14.9; Platelets 163; Potassium 4.6; Sodium 139    Lipid Panel  No results found for: CHOL, TRIG, HDL, CHOLHDL, VLDL, LDLCALC, LDLDIRECT   Wt Readings from Last 3  Encounters:  03/22/17 228 lb 14.4 oz (103.8 kg)  02/20/17 221 lb 11.2 oz (100.6 kg)      Other studies Reviewed: Additional studies/ records that were reviewed today include: TEE 02/19/17  Review of the above records today demonstrates:  - Left ventricle: Systolic function was mildly to moderately   reduced. The estimated ejection fraction was in the range of 40%   to 45%. Diffuse hypokinesis. - Aortic valve: No evidence of vegetation. - Mitral valve: No evidence of vegetation. - Left atrium: The atrium was moderately dilated. No evidence of   thrombus in the atrial cavity or appendage. - Right ventricle: Systolic function was mildly reduced. - Right atrium: No evidence of thrombus in the atrial cavity or   appendage. - Atrial septum: No defect or patent foramen ovale was identified. - Tricuspid valve: No evidence of vegetation. - Pulmonic valve: No evidence of vegetation.   ASSESSMENT AND PLAN:  1.  Atrial flutter: Status post ablation on 02/19/17. Patient is in sinus rhythm today. Tzipora Mcinroy plan to stop his anticoagulation.  2. Systolic heart failure: I'll as per his recent echocardiogram. We'll continue his beta blocker at this time. It is possible that he had a tachycardia-induced cardiomyopathy. We'll plan for repeat echo in 5 months    Current medicines are reviewed at length with the patient today.   The patient does not have concerns regarding his medicines.  The following changes were made today:  none  Labs/ tests ordered today include:  Orders Placed This Encounter  Procedures  . EKG 12-Lead     Disposition:   FU with Yuliana Vandrunen 5 months  Signed, Yoav Okane Jorja Loa, MD  03/22/2017 4:25 PM     [facility] [address] [place] [address] [phone] (office) [phone] (fax)

## 2017-03-23 ENCOUNTER — Telehealth: Payer: Self-pay | Admitting: Cardiology

## 2017-03-23 DIAGNOSIS — I5020 Unspecified systolic (congestive) heart failure: Secondary | ICD-10-CM

## 2017-03-23 MED ORDER — METOPROLOL SUCCINATE ER 25 MG PO TB24: 25.0000 mg | ORAL_TABLET | Freq: Every day | ORAL | 6 refills | Status: DC

## 2017-03-23 NOTE — Telephone Encounter (Signed)
Pt reports "my wife got online last night and looked at my chart, because I didn't get paperwork when I left, and told me that Dr. Elberta Fortis said to continue my beta blocker".  Informed pt that his wife is correct.  Explained that after he left Dr. Elberta Fortis advised me to call patient and inform him to continue BB with follow up echo and cardiology f/u in 5 months. Explained that I was going to call him today and inform him of updated plan of care. Pt understands that I will place a recall for echo and f/u w/ Crenshaw in 5 months.  I will also send an FYI to [place] and his nurse. Refills sent to pharmacy for Toprol. Patient verbalized understanding and agreeable to plan.

## 2017-03-23 NOTE — Telephone Encounter (Signed)
New message      Pt was seen yesterday.  He said Dr Elberta Fortis told him he could stop his beta blocker, but his wife said the notes said continue beta blocker (or visa versa).  Please call and confirm medication directions.

## 2017-03-24 ENCOUNTER — Telehealth: Payer: Self-pay | Admitting: *Deleted

## 2017-03-24 ENCOUNTER — Other Ambulatory Visit: Payer: Self-pay | Admitting: *Deleted

## 2017-03-24 DIAGNOSIS — I429 Cardiomyopathy, unspecified: Secondary | ICD-10-CM

## 2017-03-24 NOTE — Telephone Encounter (Signed)
-----   Message from Lewayne Bunting, MD sent at 03/23/2017  5:08 PM EDT ----- Regarding: FW: follow up Please schedule fu echo in 3 months, not 5; fu with me after echo. Olga Millers  ----- Message ----- From: Baird Lyons, RN Sent: 03/23/2017   4:53 PM To: Freddi Starr, RN, Lewayne Bunting, MD Subject: follow up                                      Saralyn Pilar saw pt yesterday post 4week AFLutter ablation. Recommendation to continue Toprol and f/u echo and appt w/ you in 5 months. I placed recall in EPIC for both. If there as an issue with this, please let me know.  thx Dory Horn, RN :)

## 2017-03-24 NOTE — Telephone Encounter (Signed)
Spoke with pt, Aware of dr Ludwig Clarks recommendations. Echo and follow up appt scheduled.

## 2017-04-30 NOTE — Addendum Note (Signed)
Addendum  created 04/30/17 1252 by Marvion Bastidas, MD   Sign clinical note    

## 2017-06-01 ENCOUNTER — Encounter: Payer: Self-pay | Admitting: *Deleted

## 2017-06-03 NOTE — Progress Notes (Signed)
HPI: FU atrial flutter and cardiomyopathy. Admitted March 2018 with newly diagnosed atrial flutter. Echocardiogram March 2018 showed ejection fraction 35-40%, moderate left ventricular hypertrophy, grade 1 diastolic dysfunction, mild mitral regurgitation, moderately reduced RV function and mild tricuspid regurgitation. Note patient was in atrial flutter at that time. Transesophageal echocardiogram showed no left atrial appendage thrombus. Patient subsequently had atrial flutter ablation. 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 Echocardiogram repeated July 2018 and showed normal LV function. Since last seen, he denies dyspnea, chest pain, palpitations or syncope.  Current Outpatient Prescriptions  Medication Sig Dispense Refill  . metoprolol succinate (TOPROL-XL) 25 MG 24 hr tablet Take 1 tablet (25 mg total) by mouth daily. 30 tablet 6  . Omega-3 Fatty Acids (FISH OIL) 500 MG CAPS Take 2 capsules by mouth daily.    Marland Kitchen testosterone cypionate (DEPOTESTOSTERONE CYPIONATE) 200 MG/ML injection Inject 100 mg into the muscle every 14 (fourteen) days.  2   No current facility-administered medications for this visit.      Past Medical History:  Diagnosis Date  . Atrial flutter (HCC)    a. s/p ablation 01/2017.  . Cardiomyopathy (HCC)    a. Dx 01/2017 in setting of atrial flutter - EF 40-45%.  Marland Kitchen GERD (gastroesophageal reflux disease)   . Obesity   . Renal insufficiency    Chronicity unknown, Cr 1.4 in 01/2017    Past Surgical History:  Procedure Laterality Date  . A-FLUTTER ABLATION N/A 02/19/2017   Procedure: A-Flutter Ablation;  Surgeon: Will Jorja Loa, MD;  [facility]: [facility];  Service: Cardiovascular;  Laterality: N/A;  . ANKLE FRACTURE SURGERY Right   . APPENDECTOMY    . ATRIAL FLUTTER ABLATION  02/19/2017  . FRACTURE SURGERY    . TONSILLECTOMY      Social History    Social History  . Marital status: Married    Spouse name: N/A  . Number of children: 4  . Years of education: N/A   Occupational History  .      [employer]   Social History Main Topics  . Smoking status: Former Smoker    Packs/day: 3.00    Years: 10.00    Types: Cigarettes  . Smokeless tobacco: Never Used     Comment: "stopped smoking in the 1990s"  . Alcohol use 12.6 oz/week    21 Glasses of wine per week     Comment: 02/19/2017 "3 drinks per night"  . Drug use: No  . Sexual activity: Not on file   Other Topics Concern  . Not on file   Social History Narrative  . No narrative on file    Family History  Problem Relation Age of Onset  . CAD Mother   . Atrial fibrillation Mother   . Lymphoma Father     ROS: no fevers or chills, productive cough, hemoptysis, dysphasia, odynophagia, melena, hematochezia, dysuria, hematuria, rash, seizure activity, orthopnea, PND, pedal edema, claudication. Remaining systems are negative.  Physical Exam: Well-developed well-nourished in no acute distress.  Skin is warm and dry.  HEENT is normal.  Neck is supple.  Chest is clear to auscultation with normal expansion.  Cardiovascular exam is regular rate and rhythm.  Abdominal exam nontender or distended. No masses palpated. Extremities show no edema. neuro grossly intact    A/P  1 Atrial flutter-status post ablation. Patient remains in sinus rhythm on examination. He is having some fatigue that  may be related to his beta blocker. Discontinue Toprol. Add Cardizem CD 180 mg daily. Anticoagulation has been discontinued.  2 cardiomyopathy-improved on follow-up echocardiogram. Previous reduction in LV function likely tachycardia mediated.  3 hypertension-blood pressure is elevated. I am discontinuing Toprol as outlined. Add Cardizem CD 180 mg daily. Follow blood pressure at home and advance medications as needed.  4 renal insufficiency-creatinine mildly elevated during recent  hospitalization. Plan repeat lab work today.   Olga MillersBrian Danetra Glock, MD

## 2017-06-04 ENCOUNTER — Other Ambulatory Visit: Payer: Self-pay

## 2017-06-04 ENCOUNTER — Ambulatory Visit (INDEPENDENT_AMBULATORY_CARE_PROVIDER_SITE_OTHER): Payer: Managed Care, Other (non HMO)

## 2017-06-04 DIAGNOSIS — I429 Cardiomyopathy, unspecified: Secondary | ICD-10-CM

## 2017-06-15 ENCOUNTER — Encounter: Payer: Self-pay | Admitting: Cardiology

## 2017-06-15 ENCOUNTER — Ambulatory Visit (INDEPENDENT_AMBULATORY_CARE_PROVIDER_SITE_OTHER): Payer: Managed Care, Other (non HMO) | Admitting: Cardiology

## 2017-06-15 VITALS — BP 150/88 | HR 91 | Ht 69.0 in | Wt 229.0 lb

## 2017-06-15 DIAGNOSIS — I4892 Unspecified atrial flutter: Secondary | ICD-10-CM

## 2017-06-15 DIAGNOSIS — I10 Essential (primary) hypertension: Secondary | ICD-10-CM

## 2017-06-15 DIAGNOSIS — I429 Cardiomyopathy, unspecified: Secondary | ICD-10-CM

## 2017-06-15 MED ORDER — DILTIAZEM HCL ER COATED BEADS 180 MG PO CP24: 180.0000 mg | ORAL_CAPSULE | Freq: Every day | ORAL | 3 refills | Status: DC

## 2017-06-15 NOTE — Patient Instructions (Signed)
Medication Instructions:   STOP METOPROLOL  START DILTIAZEM 180 MG ONCE DAILY  Labwork:  Your physician recommends that you HAVE LAB WORK TODAY  Follow-Up:  Your physician wants you to follow-up in: ONE YEAR WITH DR Shelda PalRENSHAW You will receive a reminder letter in the mail two months in advance. If you don't receive a letter, please call our office to schedule the follow-up appointment.   If you need a refill on your cardiac medications before your next appointment, please call your pharmacy.

## 2017-06-16 ENCOUNTER — Encounter: Payer: Self-pay | Admitting: *Deleted

## 2017-06-16 LAB — BASIC METABOLIC PANEL
BUN/Creatinine Ratio: 26 — ABNORMAL HIGH (ref 9–20)
BUN: 32 mg/dL — ABNORMAL HIGH (ref 6–24)
CALCIUM: 9.3 mg/dL (ref 8.7–10.2)
CO2: 23 mmol/L (ref 20–29)
Chloride: 101 mmol/L (ref 96–106)
Creatinine, Ser: 1.21 mg/dL (ref 0.76–1.27)
GFR calc Af Amer: 76 mL/min/{1.73_m2} (ref >59)
GFR, EST NON AFRICAN AMERICAN: 66 mL/min/{1.73_m2} (ref >59)
Glucose: 92 mg/dL (ref 65–99)
POTASSIUM: 4.9 mmol/L (ref 3.5–5.2)
Sodium: 140 mmol/L (ref 134–144)

## 2018-01-25 ENCOUNTER — Other Ambulatory Visit: Payer: Self-pay | Admitting: *Deleted

## 2018-01-25 MED ORDER — DILTIAZEM HCL ER COATED BEADS 180 MG PO CP24: 180.0000 mg | ORAL_CAPSULE | Freq: Every day | ORAL | 3 refills | Status: DC

## 2018-06-03 NOTE — Progress Notes (Signed)
HPI: FU atrial flutter and cardiomyopathy. Admitted March 2018 with newly diagnosed atrial flutter. Echocardiogram March 2018 showed ejection fraction 35-40%, moderate left ventricular hypertrophy, grade 1 diastolic dysfunction, mild mitral regurgitation, moderately reduced RV function and mild tricuspid regurgitation. Note patient was in atrial flutter at that time. Transesophageal echocardiogram showed no left atrial appendage thrombus. Patient subsequently had atrial flutter ablation. 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 Echocardiogram repeated July 2018 and showed normal LV function. Since last seen, the patient denies any dyspnea on exertion, orthopnea, PND, pedal edema, palpitations, syncope or chest pain.   Current Outpatient Medications  Medication Sig Dispense Refill  . Omega-3 Fatty Acids (FISH OIL) 500 MG CAPS Take 2 capsules by mouth daily.    Marland Kitchen testosterone cypionate (DEPOTESTOSTERONE CYPIONATE) 200 MG/ML injection Inject 100 mg into the muscle every 14 (fourteen) days.  2  . diltiazem (CARDIZEM CD) 180 MG 24 hr capsule Take 1 capsule (180 mg total) by mouth daily. 90 capsule 3   No current facility-administered medications for this visit.      Past Medical History:  Diagnosis Date  . Atrial flutter (HCC)    a. s/p ablation 01/2017.  . Cardiomyopathy (HCC)    a. Dx 01/2017 in setting of atrial flutter - EF 40-45%.  Marland Kitchen GERD (gastroesophageal reflux disease)   . Obesity   . Renal insufficiency    Chronicity unknown, Cr 1.4 in 01/2017    Past Surgical History:  Procedure Laterality Date  . A-FLUTTER ABLATION N/A 02/19/2017   Procedure: A-Flutter Ablation;  Surgeon: Will Jorja Loa, MD;  Location: [facility];  Service: Cardiovascular;  Laterality: N/A;  . ANKLE FRACTURE SURGERY Right   . APPENDECTOMY    . ATRIAL FLUTTER ABLATION  02/19/2017  . FRACTURE  SURGERY    . TONSILLECTOMY      Social History   Socioeconomic History  . Marital status: Married    Spouse name: Not on file  . Number of children: 4  . Years of education: Not on file  . Highest education level: Not on file  Occupational History    Comment: [employer]  Social Needs  . Financial resource strain: Not on file  . Food insecurity:    Worry: Not on file    Inability: Not on file  . Transportation needs:    Medical: Not on file    Non-medical: Not on file  Tobacco Use  . Smoking status: Former Smoker    Packs/day: 3.00    Years: 10.00    Pack years: 30.00    Types: Cigarettes  . Smokeless tobacco: Never Used  . Tobacco comment: "stopped smoking in the 1990s"  Substance and Sexual Activity  . Alcohol use: Yes    Alcohol/week: 12.6 oz    Types: 21 Glasses of wine per week    Comment: 02/19/2017 "3 drinks per night"  . Drug use: No  . Sexual activity: Not on file  Lifestyle  . Physical activity:    Days per week: Not on file    Minutes per session: Not on file  . Stress: Not on file  Relationships  . Social connections:    Talks on phone: Not on file    Gets together: Not on file    Attends religious service: Not on file    Active member of club or organization: Not on file    Attends meetings of  clubs or organizations: Not on file    Relationship status: Not on file  . Intimate partner violence:    Fear of current or ex partner: Not on file    Emotionally abused: Not on file    Physically abused: Not on file    Forced sexual activity: Not on file  Other Topics Concern  . Not on file  Social History Narrative  . Not on file    Family History  Problem Relation Age of Onset  . CAD Mother   . Atrial fibrillation Mother   . Lymphoma Father     ROS: no fevers or chills, productive cough, hemoptysis, dysphasia, odynophagia, melena, hematochezia, dysuria, hematuria, rash, seizure activity, orthopnea, PND, pedal edema, claudication. Remaining systems  are negative.  Physical Exam: Well-developed well-nourished in no acute distress.  Skin is warm and dry.  HEENT is normal.  Neck is supple.  Chest is clear to auscultation with normal expansion.  Cardiovascular exam is regular rate and rhythm.  Abdominal exam nontender or distended. No masses palpated. Extremities show no edema. neuro grossly intact  ECG-normal sinus rhythm at a rate of 75.  No ST changes.  Personally reviewed  A/P  1 atrial flutter-patient is status post ablation and no documented recurrences.  We will continue with Cardizem.  Toprol was discontinued because of fatigue.  Anticoagulation has also been discontinued.  2 cardiomyopathy-previous reduction in LV function felt likely secondary to tachycardia.  LV function now normal.  3 hypertension-blood pressure is mildly elevated; however typically controlled at home.  Continue present medications and follow.  4 history of chronic stage III kidney disease-per primary care.  Olga MillersBrian Rankin Coolman, MD

## 2018-06-09 ENCOUNTER — Encounter: Payer: Self-pay | Admitting: Cardiology

## 2018-06-09 ENCOUNTER — Ambulatory Visit: Payer: Managed Care, Other (non HMO) | Admitting: Cardiology

## 2018-06-09 VITALS — BP 146/84 | HR 75 | Ht 69.0 in | Wt 236.0 lb

## 2018-06-09 DIAGNOSIS — I10 Essential (primary) hypertension: Secondary | ICD-10-CM

## 2018-06-09 DIAGNOSIS — I4892 Unspecified atrial flutter: Secondary | ICD-10-CM

## 2018-06-09 DIAGNOSIS — I429 Cardiomyopathy, unspecified: Secondary | ICD-10-CM | POA: Diagnosis not present

## 2018-06-09 NOTE — Patient Instructions (Signed)
Your physician wants you to follow-up in: ONE YEAR with Dr. Crenshaw. You will receive a reminder letter in the mail two months in advance. If you don't receive a letter, please call our office to schedule the follow-up appointment.  

## 2018-06-28 ENCOUNTER — Ambulatory Visit: Payer: Self-pay | Admitting: Cardiology

## 2018-11-29 ENCOUNTER — Encounter (HOSPITAL_COMMUNITY): Payer: Self-pay

## 2018-11-29 ENCOUNTER — Ambulatory Visit (HOSPITAL_COMMUNITY)
Admission: EM | Admit: 2018-11-29 | Discharge: 2018-11-29 | Disposition: A | Discharge status: Home / Self Care | Payer: Managed Care, Other (non HMO) | Attending: Family Medicine | Admitting: Family Medicine

## 2018-11-29 DIAGNOSIS — T1490XA Injury, unspecified, initial encounter: Secondary | ICD-10-CM

## 2018-11-29 DIAGNOSIS — S6992XA Unspecified injury of left wrist, hand and finger(s), initial encounter: Secondary | ICD-10-CM

## 2018-11-29 DIAGNOSIS — M79675 Pain in left toe(s): Secondary | ICD-10-CM

## 2018-11-29 DIAGNOSIS — S61237A Puncture wound without foreign body of left little finger without damage to nail, initial encounter: Secondary | ICD-10-CM | POA: Diagnosis not present

## 2018-11-29 MED ORDER — DOXYCYCLINE HYCLATE 100 MG PO CAPS: 100.0000 mg | ORAL_CAPSULE | Freq: Two times a day (BID) | ORAL | 0 refills | Status: DC

## 2018-11-29 NOTE — ED Triage Notes (Signed)
Pt present a splinter in his left hand, pt states he does not know of all of the splinter is out of his hand. The incident occurred today with the splinter    Pt recently injured his left big toe in April 2019.  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  So workers comp is having him to have the toe re-evaluated because the nail is not growing back correctly on his left foot big toe.

## 2018-12-02 NOTE — ED Provider Notes (Signed)
[facility]   [id number] 11/29/18 Arrival Time: 1700  ASSESSMENT & PLAN:  1. Finger injury, left, initial encounter   2. Trauma   3. Great toe pain, left    No need for imaging at this time. Discussed that I do not recommend making and incision of his proximal finger to explore for the possibility a speck of wood remains. Given puncture wound, will place on doxycycline and watch for the next week. S/S of infection discussed.  Meds ordered this encounter  Medications  . doxycycline (VIBRAMYCIN) 100 MG capsule    Sig: Take 1 capsule (100 mg total) by mouth 2 (two) times daily.    Dispense:  20 capsule    Refill:  0   Think skin over previous location of L great toenail should thicken over time. This could continue to bleed easily. Recommend f/u with podiatry for another opinion. No sign of infection.  Follow-up Information    Tracey Harries, MD.   Specialty:  Family Medicine Why:  As needed. Contact information: [address] [place] [address] [phone]          Reviewed expectations re: course of current medical issues. Questions answered. Outlined signs and symptoms indicating need for more acute intervention. Patient verbalized understanding. After Visit Summary given.  SUBJECTIVE: History from: patient. Dean Graves is a 58 y.o. male who reports getting a wooden splinter in his L proximal 5th finger today. "Straight and sharp piece of wood." He found the tip protruding from his finger and was able to pull it out. Did not feel a break of the wood while removing it. Not sure if any left. Minimal discomfort. Bleeding from puncture. Minimal swelling without erythema. Normal finger ROM. No sensation changes or weakness of finger.  Also reports L great toe injury in April 2019. Ended up having toenail removed and it "just isn't growing back normally". Saw a podiatrist who performed procedure and followed up with him. Workers comp case  closed. He is seeking re-evaluation. Occasionally has bleeding from where proximal toenail would have been. No specific aggravating or alleviating factors reported. No pain. Able to wear normal shoe. No toe sensation changes or weakness.   Past Surgical History:  Procedure Laterality Date  . A-FLUTTER ABLATION N/A 02/19/2017   Procedure: A-Flutter Ablation;  Surgeon: Will Jorja Loa, MD;  Location: [facility];  Service: Cardiovascular;  Laterality: N/A;  . ANKLE FRACTURE SURGERY Right   . APPENDECTOMY    . ATRIAL FLUTTER ABLATION  02/19/2017  . FRACTURE SURGERY    . TONSILLECTOMY       ROS: As per HPI. All other systems negative.    OBJECTIVE:  Vitals:   11/29/18 1758  BP: (!) 146/82  Pulse: 95  Resp: 16  Temp: 98.4 F (36.9 C)  TempSrc: Oral  SpO2: 99%    General appearance: alert; no distress HEENT: [place]; AT CV: RRR Resp: CTAB Extremities: . LUE: fingers warm and well perfused; well localized mild tenderness over left proximal 5th finger; without gross deformities; with mild swelling; with no bruising; ROM: normal; no erythema; small puncture wound with very slight bleeding . LLE: warm and well perfused; missing L great toenail; very thin skin of nailbed; no active bleeding; no tednerness; nail growth not present CV: brisk extremity capillary refill of RUE, LUE, RLE and LLE Skin: warm and dry; no visible rashes Neurologic: gait normal; normal reflexes of RUE, LUE, RLE and LLE; normal sensation of RUE, LUE,  RLE and LLE; normal strength of RUE, LUE, RLE and LLE Psychological: alert and cooperative; normal mood and affect  Allergies  Allergen Reactions  . Amoxicillin Rash  . Penicillins Rash    Past Medical History:  Diagnosis Date  . Atrial flutter (HCC)    a. s/p ablation 01/2017.  . Cardiomyopathy (HCC)    a. Dx 01/2017 in setting of atrial flutter - EF 40-45%.  Marland Kitchen GERD (gastroesophageal reflux disease)   . Obesity   . Renal insufficiency    Chronicity  unknown, Cr 1.4 in 01/2017   Social History   Socioeconomic History  . Marital status: Married    Spouse name: Not on file  . Number of children: 4  . Years of education: Not on file  . Highest education level: Not on file  Occupational History    Comment: [employer]  Social Needs  . Financial resource strain: Not on file  . Food insecurity:    Worry: Not on file    Inability: Not on file  . Transportation needs:    Medical: Not on file    Non-medical: Not on file  Tobacco Use  . Smoking status: Former Smoker    Packs/day: 3.00    Years: 10.00    Pack years: 30.00    Types: Cigarettes  . Smokeless tobacco: Never Used  . Tobacco comment: "stopped smoking in the 1990s"  Substance and Sexual Activity  . Alcohol use: Yes    Alcohol/week: 21.0 standard drinks    Types: 21 Glasses of wine per week    Comment: 02/19/2017 "3 drinks per night"  . Drug use: No  . Sexual activity: Not on file  Lifestyle  . Physical activity:    Days per week: Not on file    Minutes per session: Not on file  . Stress: Not on file  Relationships  . Social connections:    Talks on phone: Not on file    Gets together: Not on file    Attends religious service: Not on file    Active member of club or organization: Not on file    Attends meetings of clubs or organizations: Not on file    Relationship status: Not on file  Other Topics Concern  . Not on file  Social History Narrative  . Not on file   Family History  Problem Relation Age of Onset  . CAD Mother   . Atrial fibrillation Mother   . Lymphoma Father    Past Surgical History:  Procedure Laterality Date  . A-FLUTTER ABLATION N/A 02/19/2017   Procedure: A-Flutter Ablation;  Surgeon: Will Jorja Loa, MD;  [facility]: [facility];  Service: Cardiovascular;  Laterality: N/A;  . ANKLE FRACTURE SURGERY Right   . APPENDECTOMY    . ATRIAL FLUTTER ABLATION  02/19/2017  . FRACTURE SURGERY    . Greig Right,  MD 12/05/18 1253

## 2019-05-04 ENCOUNTER — Other Ambulatory Visit: Payer: Self-pay | Admitting: Cardiology

## 2019-05-08 ENCOUNTER — Other Ambulatory Visit: Payer: Self-pay | Admitting: *Deleted

## 2019-06-14 NOTE — Progress Notes (Signed)
Virtual Visit via Video Note   This visit type was conducted due to national recommendations for restrictions regarding the COVID-19 Pandemic (e.g. social distancing) in an effort to limit this patient's exposure and mitigate transmission in our community.  Due to his co-morbid illnesses, this patient is at least at moderate risk for complications without adequate follow up.  This format is felt to be most appropriate for this patient at this time.  All issues noted in this document were discussed and addressed.  A limited physical exam was performed with this format.  Please refer to the patient's chart for his consent to telehealth for [facility].   Date:  06/26/2019   ID:  Dean Graves, DOB 04-03-1961, MRN [id number]  Patient Location:Home Provider Location: Home  PCP:  Bernerd Limbo, MD  Cardiologist:  Dr Stanford [name]  Evaluation Performed:  Follow-Up Visit  Chief Complaint:  FU atrial flutter and cardiomyopathy  History of Present Illness:    FU atrial flutter and cardiomyopathy. Admitted March 2018 with newly diagnosed atrial flutter. Echocardiogram March 2018 showed ejection fraction 35-40%, moderate left ventricular hypertrophy, grade 1 diastolic dysfunction, mild mitral regurgitation, moderately reduced RV function and mild tricuspid regurgitation. Note patient was in atrial flutter at that time. Transesophageal echocardiogram showed no left atrial appendage thrombus. Patient subsequently had atrial flutter ablation. 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 Since last seen,  patient denies dyspnea, chest pain, palpitations or syncope.  The patient does not have symptoms concerning for COVID-19 infection (fever, chills, cough, or new shortness of breath).    Past Medical History:  Diagnosis Date  . Atrial flutter ([place])     a. s/p ablation 01/2017.  . Cardiomyopathy ([place])    a. Dx 01/2017 in setting of atrial flutter - EF 40-45%.  Marland Kitchen GERD (gastroesophageal reflux disease)   . Obesity   . Renal insufficiency    Chronicity unknown, Cr 1.4 in 01/2017   Past Surgical History:  Procedure Laterality Date  . A-FLUTTER ABLATION N/A 02/19/2017   Procedure: A-Flutter Ablation;  Surgeon: Will Meredith Leeds, MD;  Location: [place] [facility];  Service: Cardiovascular;  Laterality: N/A;  . ANKLE FRACTURE SURGERY Right   . APPENDECTOMY    . ATRIAL FLUTTER ABLATION  02/19/2017  . FRACTURE SURGERY    . TONSILLECTOMY       Current Meds  Medication Sig  . diltiazem (CARDIZEM CD) 180 MG 24 hr capsule Take 1 capsule (180 mg total) by mouth daily.  Marland Kitchen diltiazem (TIAZAC) 180 MG 24 hr capsule TAKE ONE CAPSULE(180 MG DOSE) BY MOUTH DAILY.  Marland Kitchen doxycycline (VIBRAMYCIN) 100 MG capsule Take 1 capsule (100 mg total) by mouth 2 (two) times daily.  . Omega-3 Fatty Acids (FISH OIL) 500 MG CAPS Take 2 capsules by mouth daily.  Marland Kitchen testosterone cypionate (DEPOTESTOSTERONE CYPIONATE) 200 MG/ML injection Inject 100 mg into the muscle every 14 (fourteen) days.     Allergies:   Amoxicillin and Penicillins   Social History   Tobacco Use  . Smoking status: Former Smoker    Packs/day: 3.00    Years: 10.00    Pack years: 30.00    Types: Cigarettes  . Smokeless tobacco: Never Used  . Tobacco comment: "stopped smoking in the 1990s"  Substance Use Topics  . Alcohol use: Yes    Alcohol/week: 21.0 standard drinks    Types: 21 Glasses of  wine per week    Comment: 02/19/2017 "3 drinks per night"  . Drug use: No     Family Hx: The patient's family history includes Atrial fibrillation in his mother; CAD in his mother; Lymphoma in his father.  ROS:   Please see the history of present illness.    No Fever, chills  or productive cough All other systems reviewed and are negative.  Wt Readings from Last 3 Encounters:  06/26/19 238 lb (108  kg)  06/09/18 236 lb (107 kg)  06/15/17 229 lb (103.9 kg)     Objective:    Vital Signs:  Wt 238 lb (108 kg)   BMI 35.15 kg/m    VITAL SIGNS:  reviewed NAD Answers questions appropriately Normal affect Remainder of physical examination not performed (telehealth visit; coronavirus pandemic)  ASSESSMENT & PLAN:    1. Atrial flutter-patient is status post ablation.  He has had no documented recurrences.  Continue Cardizem.  Anticoagulation discontinued previously. 2. Hypertension-patient's blood pressure is controlled by his report.  Continue present medications and follow. 3. History of cardiomyopathy-this was felt secondary to tachycardia and has now normalized. 4. Chronic stage III kidney disease-follow-up primary care. 5. Hyperlipidemia-I will have his most recent lipids forwarded to us for review.  COVID-19 Education: The importance of social distancing was discussed today.  Time:   Today, I have spent 18 minutes with the patient with telehealth technology discussing the above problems.     Medication Adjustments/Labs and Tests Ordered: Current medicines are reviewed at length with the patient today.  Concerns regarding medicines are outlined above.   Tests Ordered: No orders of the defined types were placed in this encounter.   Medication Changes: No orders of the defined types were placed in this encounter.   Follow Up:  In Person in 1 year(s)  Signed, Olga MillersBrian Hooper Petteway, MD  06/26/2019 9:54 AM    [facility]

## 2019-06-23 ENCOUNTER — Telehealth: Payer: Self-pay | Admitting: Cardiology

## 2019-06-23 NOTE — Telephone Encounter (Signed)
Virtual Visit Pre-Appointment Phone Call  "(Name), I am calling you today to discuss your upcoming appointment. We are currently trying to limit exposure to the virus that causes COVID-19 by seeing patients at home rather than in the office."   1. Confirm consent - "In the setting of the current Covid19 crisis, you are scheduled for a (phone or video) visit with your provider on (date) at (time).  Just as we do with many in-office visits, in order for you to participate in this visit, we must obtain consent.  If you'd like, I can send this to your mychart (if signed up) or email for you to review.  Otherwise, I can obtain your verbal consent now.  All virtual visits are billed to your insurance company just like a normal visit would be.  By agreeing to a virtual visit, we'd like you to understand that the technology does not allow for your provider to perform an examination, and thus may limit your provider's ability to fully assess your condition. If your provider identifies any concerns that need to be evaluated in person, we will make arrangements to do so.  Finally, though the technology is pretty good, we cannot assure that it will always work on either your or our end, and in the setting of a video visit, we may have to convert it to a phone-only visit.  In either situation, we cannot ensure that we have a secure connection.  Are you willing to proceed?" STAFF: Did the patient verbally acknowledge consent to telehealth visit? Document YES/NO here: yes      FULL LENGTH CONSENT FOR TELE-HEALTH VISIT   I hereby voluntarily request, consent and authorize [facility] and its employed or contracted physicians, physician assistants, nurse practitioners or other licensed health care professionals (the Practitioner), to provide me with telemedicine health care services (the Services") as deemed necessary by the treating Practitioner. I acknowledge and consent to receive the Services by the  Practitioner via telemedicine. I understand that the telemedicine visit will involve communicating with the Practitioner through live audiovisual communication technology and the disclosure of certain medical information by electronic transmission. I acknowledge that I have been given the opportunity to request an in-person assessment or other available alternative prior to the telemedicine visit and am voluntarily participating in the telemedicine visit.  I understand that I have the right to withhold or withdraw my consent to the use of telemedicine in the course of my care at any time, without affecting my right to future care or treatment, and that the Practitioner or I may terminate the telemedicine visit at any time. I understand that I have the right to inspect all information obtained and/or recorded in the course of the telemedicine visit and may receive copies of available information for a reasonable fee.  I understand that some of the potential risks of receiving the Services via telemedicine include:   Delay or interruption in medical evaluation due to technological equipment failure or disruption;  Information transmitted may not be sufficient (e.g. poor resolution of images) to allow for appropriate medical decision making by the Practitioner; and/or   In rare instances, security protocols could fail, causing a breach of personal health information.  Furthermore, I acknowledge that it is my responsibility to provide information about my medical history, conditions and care that is complete and accurate to the best of my ability. I acknowledge that Practitioner's advice, recommendations, and/or decision may be based on factors not within their control, such  as incomplete or inaccurate data provided by me or distortions of diagnostic images or specimens that may result from electronic transmissions. I understand that the practice of medicine is not an exact science and that Practitioner makes  no warranties or guarantees regarding treatment outcomes. I acknowledge that I will receive a copy of this consent concurrently upon execution via email to the email address I last provided but may also request a printed copy by calling the office of [facility].   ° °I understand that my insurance will be billed for this visit.  ° °I have read or had this consent read to me. °• I understand the contents of this consent, which adequately explains the benefits and risks of the Services being provided via telemedicine.  °• I have been provided ample opportunity to ask questions regarding this consent and the Services and have had my questions answered to my satisfaction. °• I give my informed consent for the services to be provided through the use of telemedicine in my medical care ° °By participating in this telemedicine visit I agree to the above. ° °

## 2019-06-26 ENCOUNTER — Encounter: Payer: Self-pay | Admitting: Cardiology

## 2019-06-26 ENCOUNTER — Telehealth (INDEPENDENT_AMBULATORY_CARE_PROVIDER_SITE_OTHER): Payer: Managed Care, Other (non HMO) | Admitting: Cardiology

## 2019-06-26 VITALS — Wt 238.0 lb

## 2019-06-26 DIAGNOSIS — I429 Cardiomyopathy, unspecified: Secondary | ICD-10-CM | POA: Diagnosis not present

## 2019-06-26 DIAGNOSIS — I10 Essential (primary) hypertension: Secondary | ICD-10-CM

## 2019-06-26 DIAGNOSIS — I4892 Unspecified atrial flutter: Secondary | ICD-10-CM | POA: Diagnosis not present

## 2019-06-26 NOTE — Patient Instructions (Signed)

## 2019-07-06 ENCOUNTER — Telehealth: Payer: Self-pay | Admitting: *Deleted

## 2019-07-06 NOTE — Telephone Encounter (Signed)
Increase Cardizem to 240 mg daily for improved blood pressure control.  Continue diet for cholesterol. Kirk Ruths

## 2019-07-06 NOTE — Telephone Encounter (Signed)
Blood pressure today was 140/62 and his cholesterol labs from 05/16/2019 from PCP= total 188, trig 61, vldl=12, LDL 106, HDL=70. Will forward for dr Stanford [name] review

## 2019-07-10 MED ORDER — DILTIAZEM HCL ER BEADS 240 MG PO CP24: 240.0000 mg | ORAL_CAPSULE | Freq: Every day | ORAL | 3 refills | Status: DC

## 2019-07-10 NOTE — Telephone Encounter (Signed)
Spoke with pt, New script sent to the pharmacy  

## 2019-08-21 ENCOUNTER — Telehealth: Payer: Self-pay | Admitting: *Deleted

## 2019-08-21 DIAGNOSIS — R601 Generalized edema: Secondary | ICD-10-CM

## 2019-08-21 MED ORDER — HYDROCHLOROTHIAZIDE 25 MG PO TABS: 25.0000 mg | ORAL_TABLET | Freq: Every day | ORAL | 3 refills | Status: DC

## 2019-08-21 NOTE — Telephone Encounter (Signed)
Patients daughter reports swelling in feet and ankles, per dr Stanford [name] patient to start HCTZ 25 mg once daily with a BMP check in one week.

## 2019-12-22 ENCOUNTER — Other Ambulatory Visit: Payer: Self-pay

## 2019-12-22 MED ORDER — DILTIAZEM HCL ER BEADS 240 MG PO CP24: 240.0000 mg | ORAL_CAPSULE | Freq: Every day | ORAL | 1 refills | Status: DC

## 2019-12-25 ENCOUNTER — Other Ambulatory Visit: Payer: Self-pay | Admitting: Cardiology

## 2019-12-25 DIAGNOSIS — R601 Generalized edema: Secondary | ICD-10-CM

## 2019-12-25 MED ORDER — HYDROCHLOROTHIAZIDE 25 MG PO TABS: 25.0000 mg | ORAL_TABLET | Freq: Every day | ORAL | 1 refills | Status: DC

## 2019-12-25 NOTE — Telephone Encounter (Signed)
*  STAT* If patient is at the pharmacy, call can be transferred to refill team.   1. Which medications need to be refilled? (please list name of each medication and dose if known) hydrochlorothiazide (HYDRODIURIL) 25 MG tablet  2. Which pharmacy/location (including street and city if local pharmacy) is medication to be sent to? [facility] [phone] - [place], [place] - [address]  3. Do they need a 30 day or 90 day supply? 90 day  Patient is out of meds.

## 2020-06-18 ENCOUNTER — Other Ambulatory Visit: Payer: Self-pay | Admitting: Cardiology

## 2020-06-18 DIAGNOSIS — R601 Generalized edema: Secondary | ICD-10-CM

## 2020-06-26 ENCOUNTER — Other Ambulatory Visit: Payer: Self-pay | Admitting: *Deleted

## 2020-06-26 MED ORDER — DILTIAZEM HCL ER BEADS 240 MG PO CP24: 240.0000 mg | ORAL_CAPSULE | Freq: Every day | ORAL | 1 refills | Status: DC

## 2020-07-12 ENCOUNTER — Telehealth: Payer: Self-pay | Admitting: Cardiology

## 2020-07-12 NOTE — Telephone Encounter (Signed)
Spoke with patient and patient stated they would have to find another provider in [place] system because his wife switched their insurance and they are now out of network with [facility]

## 2020-07-17 ENCOUNTER — Other Ambulatory Visit: Payer: Self-pay | Admitting: Cardiology

## 2020-07-17 DIAGNOSIS — R601 Generalized edema: Secondary | ICD-10-CM

## 2020-07-17 NOTE — Telephone Encounter (Signed)
Rx has been sent to the pharmacy electronically. ° °

## 2020-07-24 ENCOUNTER — Other Ambulatory Visit: Payer: Self-pay | Admitting: Cardiology

## 2020-07-24 DIAGNOSIS — R601 Generalized edema: Secondary | ICD-10-CM

## 2020-09-23 NOTE — Progress Notes (Deleted)
HPI: FU atrial flutter and cardiomyopathy. Admitted March 2018 with newly diagnosed atrial flutter. Echocardiogram March 2018 showed ejection fraction 35-40%, moderate left ventricular hypertrophy, grade 1 diastolic dysfunction, mild mitral regurgitation, moderately reduced RV function and mild tricuspid regurgitation. Note patient was in atrial flutter at that time. Transesophageal echocardiogram showed no left atrial appendage thrombus. Patient subsequently had atrial flutter ablation. 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 Since last seen,  Current Outpatient Medications  Medication Sig Dispense Refill  . diltiazem (CARDIZEM CD) 180 MG 24 hr capsule Take 1 capsule (180 mg total) by mouth daily. 90 capsule 3  . diltiazem (TIAZAC) 240 MG 24 hr capsule Take 1 capsule (240 mg total) by mouth daily. 90 capsule 1  . doxycycline (VIBRAMYCIN) 100 MG capsule Take 1 capsule (100 mg total) by mouth 2 (two) times daily. 20 capsule 0  . hydrochlorothiazide (HYDRODIURIL) 25 MG tablet TAKE 1 TABLET(25 MG) BY MOUTH DAILY 30 tablet 1  . Omega-3 Fatty Acids (FISH OIL) 500 MG CAPS Take 2 capsules by mouth daily.    Marland Kitchen testosterone cypionate (DEPOTESTOSTERONE CYPIONATE) 200 MG/ML injection Inject 100 mg into the muscle every 14 (fourteen) days.  2   No current facility-administered medications for this visit.     Past Medical History:  Diagnosis Date  . Atrial flutter (HCC)    a. s/p ablation 01/2017.  . Cardiomyopathy (HCC)    a. Dx 01/2017 in setting of atrial flutter - EF 40-45%.  Marland Kitchen GERD (gastroesophageal reflux disease)   . Obesity   . Renal insufficiency    Chronicity unknown, Cr 1.4 in 01/2017    Past Surgical History:  Procedure Laterality Date  . A-FLUTTER ABLATION N/A 02/19/2017   Procedure: A-Flutter Ablation;  Surgeon: Will  Jorja Loa, MD;  Location: [facility];  Service: Cardiovascular;  Laterality: N/A;  . ANKLE FRACTURE SURGERY Right   . APPENDECTOMY    . ATRIAL FLUTTER ABLATION  02/19/2017  . FRACTURE SURGERY    . TONSILLECTOMY      Social History   Socioeconomic History  . Marital status: Married    Spouse name: Not on file  . Number of children: 4  . Years of education: Not on file  . Highest education level: Not on file  Occupational History    Comment: [employer]  Tobacco Use  . Smoking status: Former Smoker    Packs/day: 3.00    Years: 10.00    Pack years: 30.00    Types: Cigarettes  . Smokeless tobacco: Never Used  . Tobacco comment: "stopped smoking in the 1990s"  Substance and Sexual Activity  . Alcohol use: Yes    Alcohol/week: 21.0 standard drinks    Types: 21 Glasses of wine per week    Comment: 02/19/2017 "3 drinks per night"  . Drug use: No  . Sexual activity: Not on file  Other Topics Concern  . Not on file  Social History Narrative  . Not on file   Social Determinants of Health   Financial Resource Strain:   . Difficulty of Paying Living Expenses: Not on file  Food Insecurity:   . Worried About Programme researcher, broadcasting/film/video in the Last Year: Not on file  . Ran Out of Food in the Last Year: Not on file  Transportation Needs:   . Lack of Transportation (Medical): Not on file  .  Lack of Transportation (Non-Medical): Not on file  Physical Activity:   . Days of Exercise per Week: Not on file  . Minutes of Exercise per Session: Not on file  Stress:   . Feeling of Stress : Not on file  Social Connections:   . Frequency of Communication with Friends and Family: Not on file  . Frequency of Social Gatherings with Friends and Family: Not on file  . Attends Religious Services: Not on file  . Active Member of Clubs or Organizations: Not on file  . Attends Banker Meetings: Not on file  . Marital Status: Not on file  Intimate Partner Violence:   . Fear of  Current or Ex-Partner: Not on file  . Emotionally Abused: Not on file  . Physically Abused: Not on file  . Sexually Abused: Not on file    Family History  Problem Relation Age of Onset  . CAD Mother   . Atrial fibrillation Mother   . Lymphoma Father     ROS: no fevers or chills, productive cough, hemoptysis, dysphasia, odynophagia, melena, hematochezia, dysuria, hematuria, rash, seizure activity, orthopnea, PND, pedal edema, claudication. Remaining systems are negative.  Physical Exam: Well-developed well-nourished in no acute distress.  Skin is warm and dry.  HEENT is normal.  Neck is supple.  Chest is clear to auscultation with normal expansion.  Cardiovascular exam is regular rate and rhythm.  Abdominal exam nontender or distended. No masses palpated. Extremities show no edema. neuro grossly intact  ECG- personally reviewed  A/P  1 atrial flutter-status post ablation with no documented recurrences. Continue Cardizem. Anticoagulation discontinued previously.  2 hypertension-blood pressure controlled. Continue present medical regimen and follow.  3 hyperlipidemia-  4 history of cardiomyopathy-this was previously felt secondary to tachycardia and most recent echocardiogram shows normal LV function.  5 history of chronic stage III kidney disease-patient needs follow-up with primary care.  Olga Millers, MD

## 2020-09-30 ENCOUNTER — Ambulatory Visit: Payer: Managed Care, Other (non HMO) | Admitting: Cardiology

## 2020-10-09 ENCOUNTER — Other Ambulatory Visit: Payer: Self-pay | Admitting: Cardiology

## 2021-04-04 ENCOUNTER — Other Ambulatory Visit: Payer: Self-pay | Admitting: Cardiology

## 2023-04-27 ENCOUNTER — Other Ambulatory Visit: Payer: Self-pay | Admitting: *Deleted

## 2023-12-22 ENCOUNTER — Other Ambulatory Visit: Payer: Self-pay

## 2023-12-22 ENCOUNTER — Encounter (HOSPITAL_COMMUNITY): Payer: Self-pay

## 2023-12-22 ENCOUNTER — Emergency Department (HOSPITAL_COMMUNITY)
Admission: EM | Admit: 2023-12-22 | Discharge: 2023-12-22 | Disposition: A | Discharge status: Home / Self Care | Payer: BC Managed Care – PPO | Attending: Emergency Medicine | Admitting: Emergency Medicine

## 2023-12-22 ENCOUNTER — Emergency Department (HOSPITAL_COMMUNITY): Payer: BC Managed Care – PPO

## 2023-12-22 DIAGNOSIS — I4891 Unspecified atrial fibrillation: Secondary | ICD-10-CM | POA: Diagnosis not present

## 2023-12-22 DIAGNOSIS — Z7901 Long term (current) use of anticoagulants: Secondary | ICD-10-CM | POA: Diagnosis not present

## 2023-12-22 DIAGNOSIS — R002 Palpitations: Secondary | ICD-10-CM | POA: Diagnosis present

## 2023-12-22 LAB — BASIC METABOLIC PANEL
Anion gap: 10 (ref 5–15)
BUN: 14 mg/dL (ref 8–23)
CO2: 23 mmol/L (ref 22–32)
Calcium: 9.4 mg/dL (ref 8.9–10.3)
Chloride: 103 mmol/L (ref 98–111)
Creatinine, Ser: 1.1 mg/dL (ref 0.61–1.24)
GFR, Estimated: >60 mL/min (ref >60)
Glucose, Bld: 126 mg/dL — ABNORMAL HIGH (ref 70–99)
Potassium: 4.1 mmol/L (ref 3.5–5.1)
Sodium: 136 mmol/L (ref 135–145)

## 2023-12-22 LAB — CBC
HCT: 44.2 % (ref 39.0–52.0)
Hemoglobin: 15.3 g/dL (ref 13.0–17.0)
MCH: 31.5 pg (ref 26.0–34.0)
MCHC: 34.6 g/dL (ref 30.0–36.0)
MCV: 90.9 fL (ref 80.0–100.0)
Platelets: 180 10*3/uL (ref 150–400)
RBC: 4.86 MIL/uL (ref 4.22–5.81)
RDW: 11.9 % (ref 11.5–15.5)
WBC: 5 10*3/uL (ref 4.0–10.5)
nRBC: 0 % (ref 0.0–0.2)

## 2023-12-22 LAB — TROPONIN I (HIGH SENSITIVITY): Troponin I (High Sensitivity): 9 ng/L (ref <18)

## 2023-12-22 MED ORDER — SODIUM CHLORIDE 0.9 % IV BOLUS
1000.0000 mL | Freq: Once | INTRAVENOUS | Status: AC
  Administered 2023-12-22: 1000 mL via INTRAVENOUS

## 2023-12-22 MED ORDER — METOPROLOL TARTRATE 5 MG/5ML IV SOLN
10.0000 mg | Freq: Once | INTRAVENOUS | Status: AC
Start: 2023-12-22 — End: 2023-12-22
  Administered 2023-12-22: 10 mg via INTRAVENOUS
  Filled 2023-12-22: qty 10

## 2023-12-22 MED ORDER — PROPOFOL 10 MG/ML IV BOLUS
0.5000 mg/kg | Freq: Once | INTRAVENOUS | Status: DC
  Filled 2023-12-22: qty 20

## 2023-12-22 NOTE — ED Triage Notes (Signed)
Pt coming in from home via pov where he woke up and could feel his heart rate was elevated. Pt used his fitbit to take his pulse and it was in the 140s. Pt has a history of afib. Pt reports slight chest pain. Pt heart rate is irregular at triage.

## 2023-12-22 NOTE — Discharge Instructions (Signed)
Please follow-up with your cardiologist in the office.  If you would like I gave you information for our atrial fibrillation clinic.  They typically will be able to see you within a week.  Please return for recurrent episode that does not go away.  Continue take your medications as prescribed.

## 2023-12-22 NOTE — Sedation Documentation (Signed)
Provider entered and pt was in sinus rhythm.  Procedure cancelled.Dean Graves

## 2023-12-22 NOTE — ED Notes (Signed)
Assumed care of pt, pt reports having a fast heart rate since 2am .  Pt states he did not have chest pain but did have SOB and felt "very bad."

## 2023-12-22 NOTE — ED Provider Notes (Signed)
[place] EMERGENCY DEPARTMENT AT [facility] Provider Note   CSN: [id number] Arrival date & time: 12/22/23  5284     History  Chief Complaint  Patient presents with   Palpitations   Chest Pain    Dean Graves is a 63 y.o. male.  63 yo M with a chief complaints of a sensation like his heart was racing.  He woke up and felt it this morning.  He ended up putting on his Fitbit and saw that his heart rate was in the 140s.  Has a history of atrial flutter and is status post ablation.  He recently had an increase of his metoprolol.  Denies any other medication changes.  Denies cough congestion or fever.   Palpitations Associated symptoms: chest pain   Chest Pain Associated symptoms: palpitations        Home Medications Prior to Admission medications   Medication Sig Start Date End Date Taking? Authorizing Provider  ELIQUIS 5 MG TABS tablet Take 5 mg by mouth 2 (two) times daily.   Yes [provider]  lisinopril-hydrochlorothiazide (ZESTORETIC) 10-12.5 MG tablet Take 1 tablet by mouth daily. 08/09/23  Yes [provider]  metoprolol succinate (TOPROL-XL) 25 MG 24 hr tablet Take 50 mg by mouth daily.   Yes [provider]  testosterone cypionate (DEPOTESTOSTERONE CYPIONATE) 200 MG/ML injection Inject 100 mg into the muscle every 14 (fourteen) days. 11/20/16  Yes [provider]  TIADYLT ER 240 MG 24 hr capsule TAKE 1 CAPSULE(240 MG) BY MOUTH DAILY 04/04/21  Yes Crenshaw, Madolyn Frieze, MD      Allergies    Amoxicillin and Penicillins    Review of Systems   Review of Systems  Cardiovascular:  Positive for chest pain and palpitations.    Physical Exam Updated Vital Signs BP (!) 119/105   Pulse (!) 157   Temp 97.7 F (36.5 C)   Resp 14   Ht 5\' 9"  (1.753 m)   Wt 111.1 kg   SpO2 99%   BMI 36.18 kg/m  Physical Exam Vitals and nursing note reviewed.  Constitutional:      Appearance: He is well-developed.  HENT:     Head:  Normocephalic and atraumatic.  Eyes:     Pupils: Pupils are equal, round, and reactive to light.  Neck:     Vascular: No JVD.  Cardiovascular:     Rate and Rhythm: Tachycardia present. Rhythm irregular.     Heart sounds: No murmur heard.    No friction rub. No gallop.  Pulmonary:     Effort: No respiratory distress.     Breath sounds: No wheezing.  Abdominal:     General: There is no distension.     Tenderness: There is no abdominal tenderness. There is no guarding or rebound.  Musculoskeletal:        General: Normal range of motion.     Cervical back: Normal range of motion and neck supple.  Skin:    Coloration: Skin is not pale.     Findings: No rash.  Neurological:     Mental Status: He is alert and oriented to person, place, and time.  Psychiatric:        Behavior: Behavior normal.     ED Results / Procedures / Treatments   Labs (all labs ordered are listed, but only abnormal results are displayed) Labs Reviewed  BASIC METABOLIC PANEL - Abnormal; Notable for the following components:      Result Value  Glucose, Bld 126 (*)    All other components within normal limits  CBC  MAGNESIUM  TROPONIN I (HIGH SENSITIVITY)    EKG EKG Interpretation Date/Time:  Wednesday December 22 2023 04:18:39 EST Ventricular Rate:  162 PR Interval:  63 QRS Duration:  82 QT Interval:  282 QTC Calculation: 463 R Axis:   -80  Text Interpretation: Supraventricular tachycardia vs aflutter Multiple premature complexes, vent & supraven LAD, consider left anterior fascicular block Consider anterior infarct ST depression, probably rate related Otherwise no significant change Confirmed by Melene Plan [phone]) on 12/22/2023 4:30:21 AM  Radiology DG Chest Portable 1 View Result Date: 12/22/2023 CLINICAL DATA:  63 year old male with shortness of breath. EXAM: PORTABLE CHEST 1 VIEW COMPARISON:  Portable chest 02/18/2017. FINDINGS: Portable AP semi upright view at 0416 hours. Left chest pacer or  resuscitation pad in place. Mildly lower lung volumes. Mediastinal contours remain normal. Visualized tracheal air column is within normal limits. Allowing for portable technique the lungs are clear. No pneumothorax or pleural effusion identified. Paucity of bowel gas. No acute osseous abnormality identified. IMPRESSION: No acute cardiopulmonary abnormality. Electronically Signed   By: Odessa Fleming M.D.   On: 12/22/2023 04:45    Procedures .Critical Care  Performed by: Melene Plan, DO Authorized by: Melene Plan, DO   Critical care provider statement:    Critical care time (minutes):  35   Critical care time was exclusive of:  Separately billable procedures and treating other patients   Critical care was time spent personally by me on the following activities:  Development of treatment plan with patient or surrogate, discussions with consultants, evaluation of patient's response to treatment, examination of patient, ordering and review of laboratory studies, ordering and review of radiographic studies, ordering and performing treatments and interventions, pulse oximetry, re-evaluation of patient's condition and review of old charts   Care discussed with: admitting provider       Medications Ordered in ED Medications  propofol (DIPRIVAN) 10 mg/mL bolus/IV push 55.6 mg (has no administration in time range)  sodium chloride 0.9 % bolus 1,000 mL (1,000 mLs Intravenous New Bag/Given 12/22/23 0420)  metoprolol tartrate (LOPRESSOR) injection 10 mg (10 mg Intravenous Given 12/22/23 0420)    ED Course/ Medical Decision Making/ A&P                                 Medical Decision Making Amount and/or Complexity of Data Reviewed Labs: ordered. Radiology: ordered. ECG/medicine tests: ordered.  Risk Prescription drug management.   63 yo M with a chief complaints of feeling his heart is racing.  Patient appears to be in atrial relation with rapid ventricular response on his initial EKG.  Heart is on the  monitor there is some scattered P waves.  Will give a bolus dose of metoprolol.  Check lab work.  Bolus of IV fluids.  Patient without anemia, troponin negative, no significant electrolyte abnormalities.  Chest x-ray independently interpreted by me without focal infiltrate or pneumothorax.  As we were setting up to do conscious sedation the patient spontaneously converted to normal sinus rhythm.  Will discharge home.  Cardiology follow-up.  5:25 AM:  I have discussed the diagnosis/risks/treatment options with the patient and family.  Evaluation and diagnostic testing in the emergency department does not suggest an emergent condition requiring admission or immediate intervention beyond what has been performed at this time.  They will follow up with Cards. We also  discussed returning to the ED immediately if new or worsening sx occur. We discussed the sx which are most concerning (e.g., sudden worsening pain, fever, inability to tolerate by mouth) that necessitate immediate return. Medications administered to the patient during their visit and any new prescriptions provided to the patient are listed below.  Medications given during this visit Medications  propofol (DIPRIVAN) 10 mg/mL bolus/IV push 55.6 mg (has no administration in time range)  sodium chloride 0.9 % bolus 1,000 mL (1,000 mLs Intravenous New Bag/Given 12/22/23 0420)  metoprolol tartrate (LOPRESSOR) injection 10 mg (10 mg Intravenous Given 12/22/23 0420)     The patient appears reasonably screen and/or stabilized for discharge and I doubt any other medical condition or other [facility] requiring further screening, evaluation, or treatment in the ED at this time prior to discharge.    CHA2DS2/VAS Stroke Risk Points  Current as of 18 minutes ago     1 >= 2 Points: High Risk  1 to 1.99 Points: Medium Risk  0 Points: Low Risk    No Change      Details    This score determines the patient's risk of having a stroke if the  patient has atrial  fibrillation.       Points Metrics  0 Has Congestive Heart Failure:  No    Current as of 18 minutes ago  0 Has Vascular Disease:  No    Current as of 18 minutes ago  1 Has Hypertension:  Yes     Current as of 18 minutes ago  0 Age:  53    Current as of 18 minutes ago  0 Has Diabetes Excluding Gestational Diabetes:  No    Current as of 18 minutes ago  0 Had Stroke:  No  Had TIA:  No  Had Thromboembolism:  No    Current as of 18 minutes ago  0 Male:  No    Current as of 18 minutes ago                  Final Clinical Impression(s) / ED Diagnoses Final diagnoses:  Atrial fibrillation with RVR [facility])    Rx / DC Orders ED Discharge Orders     None         Melene Plan, DO 12/22/23 1610

## 2023-12-22 NOTE — ED Notes (Signed)
We were getting ready to cardiovert and pt converted himself.  Procedure cancelled.

## 2023-12-24 ENCOUNTER — Ambulatory Visit (HOSPITAL_COMMUNITY)
Admission: RE | Admit: 2023-12-24 | Discharge: 2023-12-24 | Disposition: A | Discharge status: Home / Self Care | Payer: BC Managed Care – PPO | Source: Ambulatory Visit | Attending: Physician Assistant | Admitting: Physician Assistant

## 2023-12-24 ENCOUNTER — Encounter (HOSPITAL_COMMUNITY): Payer: Self-pay | Admitting: Physician Assistant

## 2023-12-24 VITALS — BP 162/92 | HR 82 | Ht 69.0 in | Wt 250.6 lb

## 2023-12-24 DIAGNOSIS — I129 Hypertensive chronic kidney disease with stage 1 through stage 4 chronic kidney disease, or unspecified chronic kidney disease: Secondary | ICD-10-CM | POA: Diagnosis present

## 2023-12-24 DIAGNOSIS — I4891 Unspecified atrial fibrillation: Secondary | ICD-10-CM | POA: Diagnosis present

## 2023-12-24 DIAGNOSIS — I429 Cardiomyopathy, unspecified: Secondary | ICD-10-CM | POA: Insufficient documentation

## 2023-12-24 DIAGNOSIS — Z6837 Body mass index (BMI) 37.0-37.9, adult: Secondary | ICD-10-CM | POA: Diagnosis not present

## 2023-12-24 DIAGNOSIS — I480 Paroxysmal atrial fibrillation: Secondary | ICD-10-CM | POA: Diagnosis not present

## 2023-12-24 DIAGNOSIS — G4733 Obstructive sleep apnea (adult) (pediatric): Secondary | ICD-10-CM | POA: Diagnosis not present

## 2023-12-24 DIAGNOSIS — E669 Obesity, unspecified: Secondary | ICD-10-CM | POA: Insufficient documentation

## 2023-12-24 DIAGNOSIS — Z7901 Long term (current) use of anticoagulants: Secondary | ICD-10-CM | POA: Diagnosis not present

## 2023-12-24 DIAGNOSIS — N189 Chronic kidney disease, unspecified: Secondary | ICD-10-CM | POA: Diagnosis present

## 2023-12-24 DIAGNOSIS — I483 Typical atrial flutter: Secondary | ICD-10-CM

## 2023-12-24 DIAGNOSIS — R9431 Abnormal electrocardiogram [ECG] [EKG]: Secondary | ICD-10-CM | POA: Insufficient documentation

## 2023-12-24 MED ORDER — METOPROLOL TARTRATE 25 MG PO TABS: 25.0000 mg | ORAL_TABLET | Freq: Four times a day (QID) | ORAL | 1 refills | Status: AC | PRN

## 2023-12-24 NOTE — Progress Notes (Signed)
Primary Care Physician: Tracey Harries, MD Primary Cardiologist: None Electrophysiologist: None  Referring Physician: ED   Dean Graves is a 63 y.o. male with a history of cardiomyopathy, HTN, OSA, CKD, atrial flutter, atrial fibrillation who presents for consultation in the [facility]. He presented to the hospital on 02/19/17 with new onset palpitations. He was found to have atrial flutter. He had ablation for atrial flutter on 02/19/17 with Dr Elberta Fortis. He has been followed at [facility] and was diagnosed with atrial fibrillation as well. He has been maintained on Eliquis and metoprolol. He presented to the ED 12/22/23 with tachypalpitations. ECG appeared to be SVT vs atrial flutter vs afib with RVR. While the ED team was prepping for DCCV, the patient spontaneously converted to SR.   Patient presents today for consultation for atrial fibrillation and atrial flutter. Since his ED visit, he has not had any further symptoms. He did have URI symptoms at the time. He uses his CPAP regularly. He does drink 4-5 beers daily. No bleeding issues on anticoagulation.   Today, he denies symptoms of palpitations, chest pain, shortness of breath, orthopnea, PND, lower extremity edema, dizziness, presyncope, syncope, bleeding, or neurologic sequela. The patient is tolerating medications without difficulties and is otherwise without complaint today.    Atrial Fibrillation Risk Factors:  he does have symptoms or diagnosis of sleep apnea. he does not have a history of rheumatic fever. he does have a history of alcohol use. The patient does not have a history of early familial atrial fibrillation or other arrhythmias.  Atrial Fibrillation Management history:  Previous antiarrhythmic drugs: none Previous cardioversions: none Previous ablations: 02/19/17 flutter Anticoagulation history: Eliquis  ROS- All systems are reviewed and negative except as per the HPI above.  Past Medical  History:  Diagnosis Date   Atrial flutter (HCC)    a. s/p ablation 01/2017.   Cardiomyopathy (HCC)    a. Dx 01/2017 in setting of atrial flutter - EF 40-45%.   GERD (gastroesophageal reflux disease)    Obesity    Renal insufficiency    Chronicity unknown, Cr 1.4 in 01/2017    Current Outpatient Medications  Medication Sig Dispense Refill   ELIQUIS 5 MG TABS tablet Take 5 mg by mouth 2 (two) times daily.     lisinopril-hydrochlorothiazide (ZESTORETIC) 10-12.5 MG tablet Take 1 tablet by mouth daily.     magnesium chloride (SLOW-MAG) 64 MG TBEC SR tablet Take by mouth.     metoprolol succinate (TOPROL-XL) 25 MG 24 hr tablet Take 50 mg by mouth daily.     Red Yeast Rice Extract (RED YEAST RICE PO) Take by mouth.     testosterone cypionate (DEPOTESTOSTERONE CYPIONATE) 200 MG/ML injection Inject 100 mg into the muscle every 14 (fourteen) days.  2   TIADYLT ER 240 MG 24 hr capsule TAKE 1 CAPSULE(240 MG) BY MOUTH DAILY 60 capsule 1   No current facility-administered medications for this encounter.    Physical Exam: BP (!) 162/92   Pulse 82   Ht 5\' 9"  (1.753 m)   Wt 113.7 kg   BMI 37.01 kg/m   GEN: Well nourished, well developed in no acute distress NECK: No JVD; No carotid bruits CARDIAC: Regular rate and rhythm, no murmurs, rubs, gallops RESPIRATORY:  Clear to auscultation without rales, wheezing or rhonchi  ABDOMEN: Soft, non-tender, non-distended EXTREMITIES:  No edema; No deformity   Wt Readings from Last 3 Encounters:  12/24/23 113.7 kg  12/22/23 111.1 kg  06/26/19 108 kg     EKG today demonstrates  SR Vent. rate 82 BPM PR interval 136 ms QRS duration 80 ms QT/QTcB 366/427 ms  Echo 06/04/17 demonstrated  - Left ventricle: The cavity size was normal. There was mild    concentric hypertrophy. Systolic function was normal. The    estimated ejection fraction was in the range of 60% to 65%. Wall    motion was normal; there were no regional wall motion    abnormalities.  Left ventricular diastolic function parameters    were normal.  - Left atrium: The atrium was normal in size.  - Right ventricle: Systolic function was normal.  - Pulmonary arteries: Systolic pressure was within the normal    range.   Impressions:   - Compared to previous study (was in atrial flutter at the time),    EF has improved to normal.    CHA2DS2-VASc Score = 1  The patient's score is based upon: CHF History: 0 (EF normalized post ablation) HTN History: 1 Diabetes History: 0 Stroke History: 0 Vascular Disease History: 0 Age Score: 0 Gender Score: 0       ASSESSMENT AND PLAN: Paroxysmal Atrial Fibrillation/atrial flutter The patient's CHA2DS2-VASc score is 1, indicating a 0.6% annual risk of stroke.   Patient is s/p atrial flutter ablation 02/19/17 Cardiac monitor from [facility] reviewed today, 0% afib burden. Brief SVT episodes, longest lasting 32 beats.  Patient in SR today. This was his first significant episode of heart racing since his ablation.  Continue Toprol 50 mg daily (two 25 mg tablets) Will start Lopressor 25 mg q 6 hours PRN for heart racing Continue Eliquis 5 mg BID Continue diltiazem 240 mg daily  We discussed reduction in alcohol intake.   OSA  Encouraged nightly CPAP The importance of adequate treatment of sleep apnea was discussed today in order to improve our ability to maintain sinus rhythm long term. Followed by pulmonology at [facility].  HTN Elevated today, patient will continue to monitor for now. Has been controlled at previous visits in care everywhere.   Obesity Body mass index is 37.01 kg/m.  Encouraged lifestyle modification    Patient would like to reestablish care in [facility] system. Will refer to Dr Royann Shivers per patient request to establish care.        Jorja Loa PA-C [facility] [address] [place], [place] [address] [phone]

## 2023-12-24 NOTE — Patient Instructions (Addendum)
Metoprolol tartrate (lopressor) 25mg  - every 6 hours as needed for heart rates over 100

## 2024-02-10 ENCOUNTER — Telehealth: Payer: Self-pay | Admitting: *Deleted

## 2024-02-10 NOTE — Telephone Encounter (Signed)
 Pt has a new pt appt 02/25/24, clearance will be addressed at that time.  Will let requesting surgeon's office know.       Pre-operative Risk Assessment    Patient Name: Dean Graves  DOB: 06/25/1961 MRN: [id number]   Date of last office visit: NEW PT APPT 02/25/24 Date of next office visit:    Request for Surgical Clearance    Procedure:   LEFT TOTAL HIP ARTHROPLASTY  Date of Surgery:  Clearance TBD                                Surgeon:  Nestor Lewandowsky, MD Surgeon's Group or Practice Name:  [facility] Phone number:  [phone] Fax number:  [phone]   Type of Clearance Requested:   - Medical  - Pharmacy:  Hold Apixaban (Eliquis) NOT INDICATED   Type of Anesthesia:  Spinal   Additional requests/questions:    Wilhemina Cash   02/10/2024, 4:18 PM

## 2024-02-11 NOTE — Telephone Encounter (Signed)
   Name: Dean Graves  DOB: Jul 02, 1961  MRN: [id number]  Primary Cardiologist: None  Chart reviewed as part of pre-operative protocol coverage. The patient has an upcoming visit scheduled with Dr. Royann Shivers on 02/25/2024 at which time clearance can be addressed in case there are any issues that would impact surgical recommendations.  Left total hip arthroplasty is not scheduled until TBD as below. I added preop FYI to appointment note so that provider is aware to address at time of outpatient visit.  Per office protocol the cardiology provider should forward their finalized clearance decision and recommendations regarding antiplatelet therapy to the requesting party below.    This message will also be routed to pharmacy pool for input on holding Eliquis as requested below so that this information is available to the clearing provider at time of patient's appointment.   I will route this message as FYI to requesting party and remove this message from the preop box as separate preop APP input not needed at this time.   Please call with any questions.  Joylene Grapes, NP  02/11/2024, 9:29 AM

## 2024-02-11 NOTE — Telephone Encounter (Signed)
 Patient with diagnosis of atrial fibrillation on Eliquis for anticoagulation.    Procedure:   LEFT TOTAL HIP ARTHROPLASTY   Date of Surgery:  Clearance TBD      CHA2DS2-VASc Score = 1   This indicates a 0.6% annual risk of stroke. The patient's score is based upon: CHF History: 0 (EF normalized post ablation) HTN History: 1 Diabetes History: 0 Stroke History: 0 Vascular Disease History: 0 Age Score: 0 Gender Score: 0      CrCl 111 Platelet count 180  Per office protocol, patient can hold Eliquis for 3 days prior to procedure.   Patient will not need bridging with Lovenox (enoxaparin) around procedure.  **This guidance is not considered finalized until pre-operative APP has relayed final recommendations.**

## 2024-02-25 ENCOUNTER — Ambulatory Visit: Payer: BC Managed Care – PPO | Attending: Cardiovascular Disease | Admitting: Cardiovascular Disease

## 2024-02-25 ENCOUNTER — Encounter: Payer: Self-pay | Admitting: Cardiovascular Disease

## 2024-02-25 VITALS — BP 112/62 | HR 72 | Ht 69.0 in | Wt 240.8 lb

## 2024-02-25 DIAGNOSIS — I483 Typical atrial flutter: Secondary | ICD-10-CM

## 2024-02-25 DIAGNOSIS — I10 Essential (primary) hypertension: Secondary | ICD-10-CM | POA: Diagnosis not present

## 2024-02-25 DIAGNOSIS — Z8679 Personal history of other diseases of the circulatory system: Secondary | ICD-10-CM

## 2024-02-25 DIAGNOSIS — D6869 Other thrombophilia: Secondary | ICD-10-CM | POA: Diagnosis not present

## 2024-02-25 DIAGNOSIS — I480 Paroxysmal atrial fibrillation: Secondary | ICD-10-CM | POA: Diagnosis not present

## 2024-02-25 NOTE — Progress Notes (Signed)
 Cardiology Office Note:    Date:  03/04/2024   ID:  Dean Graves, DOB 06/23/1961, MRN [id number]  PCP:  Tracey Harries, MD   [facility] Providers Cardiologist:  None     Referring MD: Danice Goltz, PA   Chief Complaint  Patient presents with   Irregular Heart Beat    History of Present Illness:    Dean Graves is a 63 y.o. male with a hx of tachycardia cardiomyopathy, hypertension, OSA, chronic kidney disease, atrial flutter and atrial fibrillation, who has recently had recurrent palpitations.  His daughter Dean Graves is a Engineer, civil (consulting) and is accompanying him today.  He had cavotricuspid isthmus flutter ablation for atrial flutter in 2018 with Dr. Elberta Fortis.  Subsequently was seen at [facility] and reportedly had atrial fibrillation so he has continued treatment with Eliquis and metoprolol.  He was seen in the emergency room 12/22/2023 with rapid palpitations, ECG showed atrial fibrillation with RVR in 170s range, but before cardioversion was performed he spontaneously converted to sinus rhythm.  He had another episode of atrial fibrillation on 12/20/2023 lasting for about 15 minutes with a rapid rate at about 138 bpm.  Outpatient monitor performed through [facility] showed no atrial fibrillation, but did show a lengthy episodes of nonsustained atrial tachycardia lasting for up to 32 beats.  At this point CHA2DS2-VASc or is 1 (HTN), but will soon be 2 when he turns 64.  He was then seen in the A-fib clinic in late January and was asymptomatic.  His blood pressure was elevated.  The patient specifically denies any chest pain at rest or with exertion, dyspnea at rest or with exertion, orthopnea, paroxysmal nocturnal dyspnea, syncope, palpitations, focal neurological deficits, intermittent claudication, lower extremity edema, unexplained weight gain, cough, hemoptysis or wheezing.  He works as a Holiday representative.  Does not have any difficulty with physical activity.  He is planning a  left total hip replacement Dr. Orvis Brill at [facility] orthopedics.  Past Medical History:  Diagnosis Date   Atrial flutter (HCC)    a. s/p ablation 01/2017.   Cardiomyopathy (HCC)    a. Dx 01/2017 in setting of atrial flutter - EF 40-45%.   GERD (gastroesophageal reflux disease)    Obesity    Renal insufficiency    Chronicity unknown, Cr 1.4 in 01/2017    Past Surgical History:  Procedure Laterality Date   A-FLUTTER ABLATION N/A 02/19/2017   Procedure: A-Flutter Ablation;  Surgeon: Will Jorja Loa, MD;  Location: [facility] INVASIVE CV LAB;  Service: Cardiovascular;  Laterality: N/A;   ANKLE FRACTURE SURGERY Right    APPENDECTOMY     ATRIAL FLUTTER ABLATION  02/19/2017   FRACTURE SURGERY     TONSILLECTOMY      Current Medications: Current Meds  Medication Sig   ELIQUIS 5 MG TABS tablet Take 5 mg by mouth 2 (two) times daily.   lisinopril-hydrochlorothiazide (ZESTORETIC) 10-12.5 MG tablet Take 1 tablet by mouth daily.   magnesium chloride (SLOW-MAG) 64 MG TBEC SR tablet Take by mouth.   metoprolol succinate (TOPROL-XL) 25 MG 24 hr tablet Take 50 mg by mouth daily.   Red Yeast Rice Extract (RED YEAST RICE PO) Take by mouth.   testosterone cypionate (DEPOTESTOSTERONE CYPIONATE) 200 MG/ML injection Inject 100 mg into the muscle every 14 (fourteen) days.   TIADYLT ER 240 MG 24 hr capsule TAKE 1 CAPSULE(240 MG) BY MOUTH DAILY   traMADol (ULTRAM) 50 MG tablet Take 50 mg by mouth every 6 (six) hours as needed.   [  DISCONTINUED] metoprolol succinate (TOPROL-XL) 50 MG 24 hr tablet Take 50 mg by mouth daily. Patient takes 1/2 tablet twice daily   [DISCONTINUED] Misc. Devices MISC New CPAP at 11 cm. water pressure.  Prefer Resmed S11 CPAP machine with a mask, supplies and heated tubing for OSA . Please use a mask of patient preference.  Send to a current DME.     Allergies:   Amoxicillin and Penicillins   Social History   Socioeconomic History   Marital status: Married    Spouse name: Not on  file   Number of children: 4   Years of education: Not on file   Highest education level: Not on file  Occupational History    Comment: Carpenter  Tobacco Use   Smoking status: Former    Current packs/day: 3.00    Average packs/day: 3.0 packs/day for 10.0 years (30.0 ttl pk-yrs)    Types: Cigarettes   Smokeless tobacco: Never   Tobacco comments:    Former smoker as of 12/24/23  Substance and Sexual Activity   Alcohol use: Yes    Alcohol/week: 28.0 standard drinks of alcohol    Types: 28 Cans of beer per week    Comment: 4 beers daily as of 12/24/23   Drug use: Not Currently   Sexual activity: Not on file  Other Topics Concern   Not on file  Social History Narrative   Not on file   Social Drivers of Health   Financial Resource Strain: Low Risk  (01/12/2024)   Received from [facility]   Overall Financial Resource Strain (CARDIA)    Difficulty of Paying Living Expenses: Not very hard  Food Insecurity: No Food Insecurity (01/12/2024)   Received from [facility]   Hunger Vital Sign    Worried About Running Out of Food in the Last Year: Never true    Ran Out of Food in the Last Year: Never true  Transportation Needs: No Transportation Needs (01/12/2024)   Received from [facility] - Transportation    Lack of Transportation (Medical): No    Lack of Transportation (Non-Medical): No  Physical Activity: Unknown (01/12/2024)   Received from [facility]   Exercise Vital Sign    Days of Exercise per Week: Patient declined    Minutes of Exercise per Session: Not on file  Stress: Patient Declined (01/12/2024)   Received from [facility] - Occupational Stress Questionnaire    Feeling of Stress : Patient declined  Social Connections: Patient Declined (01/12/2024)   Received from [facility]   Social Network    How would you rate your social network (family, work, friends)?: Patient declined     Family History: The  patient's family history includes Atrial fibrillation in his mother; CAD in his mother; Lymphoma in his father.  ROS:   Please see the history of present illness.     All other systems reviewed and are negative.  EKGs/Labs/Other Studies Reviewed:    The following studies were reviewed today: Echocardiogram 06/04/2017 - Left ventricle: The cavity size was normal. There was mild    concentric hypertrophy. Systolic function was normal. The    estimated ejection fraction was in the range of 60% to 65%. Wall    motion was normal; there were no regional wall motion    abnormalities. Left ventricular diastolic function parameters    were normal.  - Left atrium: The atrium was normal in size.  - Right ventricle:  Systolic function was normal.  - Pulmonary arteries: Systolic pressure was within the normal    range.   Impressions:   - Compared to previous study (was in atrial flutter at the time),    EF has improved to normal.    Reviewed the tracing from 12/22/2023 which shows atrial fibrillation versus atypical atrial flutter with rapid ventricular response.  Looks different from the ECG from 2018 that showed typical counterclockwise right atrial flutter.   EKG Interpretation Date/Time:  Friday February 25 2024 08:42:37 EDT Ventricular Rate:  72 PR Interval:  132 QRS Duration:  84 QT Interval:  404 QTC Calculation: 442 R Axis:   -28  Text Interpretation: Normal sinus rhythm Normal ECG When compared with ECG of 24-Dec-2023 08:36, No significant change since last tracing (report of PACs and PVCs was incorrect) Confirmed by Lujain Kraszewski ([id number]) on 02/25/2024 8:47:06 AM    Recent Labs: 12/22/2023: BUN 14; Creatinine, Ser 1.10; Hemoglobin 15.3; Platelets 180; Potassium 4.1; Sodium 136  Recent Lipid Panel No results found for: "CHOL", "TRIG", "HDL", "CHOLHDL", "VLDL", "LDLCALC", "LDLDIRECT"   Risk Assessment/Calculations:    CHA2DS2-VASc Score = 1   This indicates a 0.6% annual risk  of stroke. The patient's score is based upon: CHF History: 0 (EF normalized post ablation) HTN History: 1 Diabetes History: 0 Stroke History: 0 Vascular Disease History: 0 Age Score: 0 Gender Score: 0            Physical Exam:    VS:  BP 112/62 (BP Location: Left Arm, Patient Position: Sitting, Cuff Size: Large)   Pulse 72   Ht 5\' 9"  (1.753 m)   Wt 240 lb 12.8 oz (109.2 kg)   SpO2 93%   BMI 35.56 kg/m     Wt Readings from Last 3 Encounters:  02/25/24 240 lb 12.8 oz (109.2 kg)  12/24/23 250 lb 9.6 oz (113.7 kg)  12/22/23 245 lb (111.1 kg)     GEN: Severely obese, well nourished, well developed in no acute distress HEENT: Normal NECK: No JVD; No carotid bruits LYMPHATICS: No lymphadenopathy CARDIAC: RRR, no murmurs, rubs, gallops RESPIRATORY:  Clear to auscultation without rales, wheezing or rhonchi  ABDOMEN: Soft, non-tender, non-distended MUSCULOSKELETAL:  No edema; No deformity  SKIN: Warm and dry NEUROLOGIC:  Alert and oriented x 3 PSYCHIATRIC:  Normal affect   ASSESSMENT:    1. Paroxysmal atrial fibrillation (HCC)   2. History of atrial flutter   3. Essential hypertension   4. Acquired thrombophilia (HCC)    PLAN:    In order of problems listed above:  Paroxysmal atrial fibrillation: I think he is a good candidate for pulmonary vein isolation/atrial fibrillation ablation.  He does not have much in the way of structural heart disease.  His episodes of atrial fibrillation are extremely fast and symptomatic.  He is quite young.  Could benefit from ablation approach early on, rather than antiarrhythmics.  Currently asymptomatic, in sinus rhythm.  Refer back to Dr. Elberta Fortis. HTN: Blood pressure is currently well-controlled on lisinopril-hydrochlorothiazide.  Also on beta-blockers for rate control for breakthrough atrial fibrillation and has been Toprol tartrate as needed for acute events. Anticoagulation: Tolerating the Eliquis well without any bleeding problems.   Cost is a concern.  Would recommend staying on the anticoagulant since this will expedite management.           Medication Adjustments/Labs and Tests Ordered: Current medicines are reviewed at length with the patient today.  Concerns regarding medicines are outlined above.  Orders  Placed This Encounter  Procedures   Ambulatory referral to Cardiac Electrophysiology   EKG 12-Lead   No orders of the defined types were placed in this encounter.   Patient Instructions  Medication Instructions:  No changes *If you need a refill on your cardiac medications before your next appointment, please call your pharmacy*  Follow-Up: At [facility], you and your health needs are our priority.  As part of our continuing mission to provide you with exceptional heart care, our providers are all part of one team.  This team includes your primary Cardiologist (physician) and Advanced Practice Providers or APPs (Physician Assistants and Nurse Practitioners) who all work together to provide you with the care you need, when you need it.  Your next appointment:   1 year(s)  Provider:   Dr Royann Shivers  We recommend signing up for the patient portal called "MyChart".  Sign up information is provided on this After Visit Summary.  MyChart is used to connect with patients for Virtual Visits (Telemedicine).  Patients are able to view lab/test results, encounter notes, upcoming appointments, etc.  Non-urgent messages can be sent to your provider as well.   To learn more about what you can do with MyChart, go to [email or web address].         1st Floor: - Lobby - Registration  - Pharmacy  - Lab - Cafe  2nd Floor: - PV Lab - Diagnostic Testing (echo, CT, nuclear med)  3rd Floor: - Vacant  4th Floor: - TCTS (cardiothoracic surgery) - AFib Clinic - Structural Heart Clinic - Vascular Surgery  - Vascular Ultrasound  5th Floor: - HeartCare Cardiology (general and EP) - Clinical Pharmacy  for coumadin, hypertension, lipid, weight-loss medications, and med management appointments    Valet parking services will be available as well.      Signed, Thurmon Fair, MD  03/04/2024 8:56 PM    [facility]

## 2024-02-25 NOTE — Patient Instructions (Signed)
 Medication Instructions:  No changes *If you need a refill on your cardiac medications before your next appointment, please call your pharmacy*  Follow-Up: At [facility], you and your health needs are our priority.  As part of our continuing mission to provide you with exceptional heart care, our providers are all part of one team.  This team includes your primary Cardiologist (physician) and Advanced Practice Providers or APPs (Physician Assistants and Nurse Practitioners) who all work together to provide you with the care you need, when you need it.  Your next appointment:   1 year(s)  Provider:   Dr Royann Shivers  We recommend signing up for the patient portal called "MyChart".  Sign up information is provided on this After Visit Summary.  MyChart is used to connect with patients for Virtual Visits (Telemedicine).  Patients are able to view lab/test results, encounter notes, upcoming appointments, etc.  Non-urgent messages can be sent to your provider as well.   To learn more about what you can do with MyChart, go to [email or web address].         1st Floor: - Lobby - Registration  - Pharmacy  - Lab - Cafe  2nd Floor: - PV Lab - Diagnostic Testing (echo, CT, nuclear med)  3rd Floor: - Vacant  4th Floor: - TCTS (cardiothoracic surgery) - AFib Clinic - Structural Heart Clinic - Vascular Surgery  - Vascular Ultrasound  5th Floor: - HeartCare Cardiology (general and EP) - Clinical Pharmacy for coumadin, hypertension, lipid, weight-loss medications, and med management appointments    Valet parking services will be available as well.

## 2024-03-02 ENCOUNTER — Other Ambulatory Visit (HOSPITAL_COMMUNITY): Payer: Self-pay

## 2024-03-02 ENCOUNTER — Telehealth: Payer: Self-pay | Admitting: Cardiovascular Disease

## 2024-03-02 MED ORDER — METOPROLOL SUCCINATE ER 25 MG PO TB24
50.0000 mg | ORAL_TABLET | Freq: Every day | ORAL | 3 refills | Status: DC
  Filled 2024-03-02: qty 180, 90d supply, fill #0

## 2024-03-02 MED ORDER — METOPROLOL SUCCINATE ER 25 MG PO TB24: 50.0000 mg | ORAL_TABLET | Freq: Every day | ORAL | 3 refills | Status: DC

## 2024-03-02 NOTE — Telephone Encounter (Signed)
  Pt c/o medication issue:  1. Name of Medication:   metoprolol succinate (TOPROL-XL) 50 MG 24 hr tablet    2. How are you currently taking this medication (dosage and times per day)? Take 50 mg by mouth daily. Patient takes 1/2 tablet twice daily   3. Are you having a reaction (difficulty breathing--STAT)? No   4. What is your medication issue? Pt is requesting to get prescription for this medication but he wants it to be 25 mg so he doesn't have to cut the pills in half

## 2024-03-02 NOTE — Telephone Encounter (Signed)
 Spoke to patient. Verified name and DOB. Informed patient prescription was sent to [facility] on [address]. Patient verbalized understanding.   Josie LPN

## 2024-03-16 NOTE — Telephone Encounter (Signed)
 Dr. Alvis Ba  We have received a surgical clearance request for Dean Graves for total hip arthroplasty. They were seen recently in clinic on 02/25/2024. Can you please comment on surgical clearance for his upcoming procedure.. Please forward you guidance and recommendations to P CV DIV PREOP   Thanks,  Charles Connor, NP

## 2024-03-16 NOTE — Telephone Encounter (Signed)
 I will forward to preop APP to review if the pt has been cleared.

## 2024-03-16 NOTE — Telephone Encounter (Signed)
 Marily Shows with [facility] called in asking for an update on clearance request. Pt was seen 02/25/24. Please advise.

## 2024-03-17 NOTE — Telephone Encounter (Signed)
 Low risk for hip surgery

## 2024-03-17 NOTE — Telephone Encounter (Signed)
   Patient Name: Dean Graves  DOB: 03/25/1961 MRN: [id number]  Primary Cardiologist: None  Chart reviewed as part of pre-operative protocol coverage. Given past medical history and time since last visit, based on ACC/AHA guidelines, Dean Graves is at acceptable risk for the planned procedure without further cardiovascular testing.   Per office protocol, patient can hold Eliquis  for 3 days prior to procedure.   Patient will not need bridging with Lovenox (enoxaparin) around procedure.  The patient was advised that if he develops new symptoms prior to surgery to contact our office to arrange for a follow-up visit, and he verbalized understanding.  I will route this recommendation to the requesting party via Epic fax function and remove from pre-op pool.  Please call with questions.  Francene Ing, Retha Cast, NP 03/17/2024, 8:19 AM

## 2024-03-20 ENCOUNTER — Telehealth: Payer: Self-pay

## 2024-03-20 NOTE — Telephone Encounter (Signed)
   Pre-operative Risk Assessment    Patient Name: Dean Graves  DOB: Feb 10, 1961 MRN: [id number]   Date of last office visit: 02/25/2024 Dr. Luana Rumple, MD Date of next office visit: NONE   Request for Surgical Clearance    Procedure:   Left Total Hip Arthroplasty  Date of Surgery:  Clearance TBD                                Surgeon: Dr. Elgie Grist. Carry Clapper, MD Surgeon's Group or Practice Name: [facility] Phone number: [phone] Fax number: [phone]   Type of Clearance Requested:   - Medical  - Pharmacy:  Hold Apixaban  (Eliquis )     Type of Anesthesia:  Spinal   Additional requests/questions:    Tyrus Gallus   03/20/2024, 10:47 AM

## 2024-03-20 NOTE — Telephone Encounter (Signed)
 Low risk for hip surgery

## 2024-03-21 NOTE — Telephone Encounter (Addendum)
 Patient with diagnosis of afib on Eliquis  for anticoagulation.    Procedure: Left Total Hip Arthroplasty  Date of procedure: TBD   CHA2DS2-VASc Score = 1   This indicates a 0.6% annual risk of stroke. The patient's score is based upon: CHF History: 0 (EF normalized post ablation) HTN History: 1 Diabetes History: 0 Stroke History: 0 Vascular Disease History: 0 Age Score: 0 Gender Score: 0       CrCl 82 ml/min Platelet count 170  Per office protocol, patient can hold Eliquis  for 3 days prior to procedure.    **This guidance is not considered finalized until pre-operative APP has relayed final recommendations.**

## 2024-03-21 NOTE — Telephone Encounter (Signed)
   Patient Name: Dean Graves  DOB: [birth year] MRN: [id number]  Primary Cardiologist: Luana Rumple, MD  Chart reviewed as part of pre-operative protocol coverage.  Patient was last seen in the office on 02/25/2024 by Dr. Alvis Ba and was doing well.  Per Dr. Alvis Ba, "Low risk for hip surgery."   Therefore, given past medical history and time since last visit, based on ACC/AHA guidelines, Dean Graves is at acceptable risk for the planned procedure without further cardiovascular testing.   Per office protocol, patient can hold Eliquis  for 3 days prior to procedure. Please resume Eliquis  as soon as possible postprocedure, at the discretion of the surgeon.   I will route this recommendation to the requesting party via Epic fax function and remove from pre-op pool.  Please call with questions.  Jude Norton, NP 03/21/2024, 11:07 AM

## 2024-05-11 ENCOUNTER — Other Ambulatory Visit: Payer: Self-pay | Admitting: Cardiovascular Disease

## 2024-05-11 ENCOUNTER — Telehealth: Payer: Self-pay | Admitting: Cardiovascular Disease

## 2024-05-11 MED ORDER — ELIQUIS 5 MG PO TABS
5.0000 mg | ORAL_TABLET | Freq: Two times a day (BID) | ORAL | 1 refills | Status: AC
Start: 2024-05-11 — End: ?

## 2024-05-11 NOTE — Telephone Encounter (Signed)
*  STAT* If patient is at the pharmacy, call can be transferred to refill team.   1. Which medications need to be refilled? (please list name of each medication and dose if known)   ELIQUIS  5 MG TABS tablet   2. Would you like to learn more about the convenience, safety, & potential cost savings by using the [facility]?   3. Are you open to using the [facility] (Type [facility]).    4. Which pharmacy/location (including street and city if local pharmacy) is medication to be sent to?  [id number] - [place], [place] - [address]   5. Do they need a 30 day or 90 day supply?   90 day  Patient stated he is completely out of this medication.

## 2024-05-11 NOTE — Telephone Encounter (Signed)
 Prescription refill request for Eliquis  received. Indication: aflutter, afib  Last office visit: Dr. Tita Form, 02/25/2024 Scr: 1.10, 12/22/2023 Age: 63 yo  Weight: 109.2 kg   Refill sent.

## 2024-05-29 ENCOUNTER — Telehealth: Payer: Self-pay | Admitting: Cardiovascular Disease

## 2024-05-29 MED ORDER — DILTIAZEM HCL ER BEADS 240 MG PO CP24: 240.0000 mg | ORAL_CAPSULE | Freq: Every day | ORAL | 2 refills | Status: AC

## 2024-05-29 NOTE — Telephone Encounter (Signed)
 Pt's medication was sent to pt's pharmacy as requested. Confirmation received.

## 2024-05-29 NOTE — Telephone Encounter (Signed)
*  STAT* If patient is at the pharmacy, call can be transferred to refill team.   1. Which medications need to be refilled? (please list name of each medication and dose if known)   TIADYLT  ER 240 MG 24 hr capsule    2. Which pharmacy/location (including street and city if local pharmacy) is medication to be sent to? [id number] [place], [place] - [address] AT [facility] Phone: [phone]  Fax: [phone]   3. Do they need a 30 day or 90 day supply? 90

## 2024-11-15 ENCOUNTER — Other Ambulatory Visit: Payer: Self-pay | Admitting: Cardiovascular Disease

## 2024-11-15 NOTE — Telephone Encounter (Signed)
 Prescription refill request for Eliquis  received. Indication:afib Last office visit:3/25 Scr: 1.10  1/25 Age:63 Weight:109.2  kg  Prescription refilled

## 2024-12-05 ENCOUNTER — Telehealth: Payer: Self-pay | Admitting: Cardiovascular Disease

## 2024-12-05 NOTE — Telephone Encounter (Signed)
 Thank u!

## 2024-12-05 NOTE — Telephone Encounter (Signed)
 Patient was to speak with the nurse but we got disconnected. Please advise

## 2024-12-05 NOTE — Telephone Encounter (Signed)
 Spoke with Pt. Yesterday ate a salty lunch and HR 154 started shortly after. Took lopressor  HR bounced up and down through the afternoon. Took another lopressor  at 7pm. Went down into 80's/90's. Felt lightheaded at some point during episode and some discomfort and base of neck. Fitbit told him his HR was up.  Has appointment with Dr Francyne in March. There are not current open appointments with him before. Has seen Afib clinic and Dr Inocencio in the past. Will route to Afib clinic and Dr Francyne for further advisement.

## 2024-12-12 ENCOUNTER — Other Ambulatory Visit (HOSPITAL_COMMUNITY): Payer: Self-pay

## 2024-12-12 ENCOUNTER — Telehealth (HOSPITAL_COMMUNITY): Payer: Self-pay

## 2024-12-12 ENCOUNTER — Ambulatory Visit (HOSPITAL_COMMUNITY)
Admission: RE | Admit: 2024-12-12 | Discharge: 2024-12-12 | Disposition: A | Discharge status: Home / Self Care | Source: Ambulatory Visit | Attending: Physician Assistant | Admitting: Physician Assistant

## 2024-12-12 VITALS — BP 124/70 | HR 72 | Ht 69.0 in | Wt 248.4 lb

## 2024-12-12 DIAGNOSIS — I480 Paroxysmal atrial fibrillation: Secondary | ICD-10-CM | POA: Diagnosis not present

## 2024-12-12 DIAGNOSIS — I4891 Unspecified atrial fibrillation: Secondary | ICD-10-CM | POA: Diagnosis not present

## 2024-12-12 MED ORDER — DRONEDARONE HCL 400 MG PO TABS: 400.0000 mg | ORAL_TABLET | Freq: Two times a day (BID) | ORAL | 4 refills | Status: AC

## 2024-12-12 NOTE — Telephone Encounter (Signed)
 Pharmacy Patient Advocate Encounter  Insurance verification completed.    The patient is insured through [facility]. Patient has [employer], may use a copay card, and/or apply for patient assistance if available.    Ran test claim for Multaq  400mg  tablet and the current 30 day co-pay is $40.   This test claim was processed through [employer]- copay amounts may vary at other pharmacies due to [employer], or as the patient moves through the different stages of their insurance plan.

## 2024-12-12 NOTE — Patient Instructions (Addendum)
Start Multaq 400mg twice a day WITH FOOD 

## 2024-12-12 NOTE — Progress Notes (Signed)
 "   Primary Care Physician: Pura Lenis, MD Primary Cardiologist: Jerel Balding, MD Electrophysiologist: None  Referring Physician: ED   Lenis SQUIBB Zylstra is a 64 y.o. male with a history of cardiomyopathy, HTN, OSA, CKD, atrial flutter, atrial fibrillation who presents for follow up in the [facility]. He presented to the hospital on 02/19/17 with new onset palpitations. He was found to have atrial flutter. He had ablation for atrial flutter on 02/19/17 with Dr Inocencio. He has been followed at [facility] and was diagnosed with atrial fibrillation as well. He has been maintained on Eliquis  and metoprolol . He presented to the ED 12/22/23 with tachypalpitations. ECG appeared to be SVT vs atrial flutter vs afib with RVR. While the ED team was prepping for DCCV, the patient spontaneously converted to SR.   Patient returns for follow up for atrial fibrillation. He is currently in SR and feels well. However, a week ago he had an episode of rapid afib which lasted for several hours. He believes that eating a very salty meal triggered his episode. In hindsight, he does occasionally have short tachypalpitations with SOB lasting only a few minutes. No significant bleeding issues on anticoagulation.   Today, he  denies symptoms of chest pain, shortness of breath, orthopnea, PND, lower extremity edema, dizziness, presyncope, syncope, bleeding, or neurologic sequela. The patient is tolerating medications without difficulties and is otherwise without complaint today.    Atrial Fibrillation Risk Factors:  he does have symptoms or diagnosis of sleep apnea. he does not have a history of rheumatic fever. he does have a history of alcohol use. 4-5 beers daily The patient does not have a history of early familial atrial fibrillation or other arrhythmias.  Atrial Fibrillation Management history:  Previous antiarrhythmic drugs: none Previous cardioversions: none Previous ablations: 02/19/17  flutter Anticoagulation history: Eliquis   ROS- All systems are reviewed and negative except as per the HPI above.  Past Medical History:  Diagnosis Date   Atrial flutter (HCC)    a. s/p ablation 01/2017.   Cardiomyopathy (HCC)    a. Dx 01/2017 in setting of atrial flutter - EF 40-45%.   GERD (gastroesophageal reflux disease)    Obesity    Renal insufficiency    Chronicity unknown, Cr 1.4 in 01/2017    Current Outpatient Medications  Medication Sig Dispense Refill   diltiazem  (TIADYLT  ER) 240 MG 24 hr capsule Take 1 capsule (240 mg total) by mouth daily. 90 capsule 2   dronedarone  (MULTAQ ) 400 MG tablet Take 1 tablet (400 mg total) by mouth 2 (two) times daily with a meal. 60 tablet 4   ELIQUIS  5 MG TABS tablet TAKE 1 TABLET(5 MG) BY MOUTH TWICE DAILY 180 tablet 1   lisinopril-hydrochlorothiazide  (ZESTORETIC) 10-12.5 MG tablet Take 1 tablet by mouth daily.     magnesium chloride (SLOW-MAG) 64 MG TBEC SR tablet Take by mouth. (Patient taking differently: Take 1 tablet by mouth daily.)     metoprolol  succinate (TOPROL -XL) 25 MG 24 hr tablet Take 2 tablets (50 mg total) by mouth daily. Take 1 tablet (25 mg) twice daily (total of 50 mg daily) 180 tablet 3   metoprolol  tartrate (LOPRESSOR ) 25 MG tablet Take 1 tablet (25 mg total) by mouth every 6 (six) hours as needed (heart rates over 100). 60 tablet 1   testosterone cypionate (DEPOTESTOSTERONE CYPIONATE) 200 MG/ML injection Inject 100 mg into the muscle every 14 (fourteen) days.  2   Red Yeast Rice Extract (RED YEAST RICE PO)  Take by mouth. (Patient not taking: Reported on 12/12/2024)     No current facility-administered medications for this encounter.    Physical Exam: BP 124/70   Pulse 72   Ht 5' 9 (1.753 m)   Wt 112.7 kg   BMI 36.68 kg/m   GEN: Well nourished, well developed in no acute distress CARDIAC: Regular rate and rhythm, no murmurs, rubs, gallops RESPIRATORY:  Clear to auscultation without rales, wheezing or rhonchi   ABDOMEN: Soft, non-tender, non-distended EXTREMITIES:  No edema; No deformity    Wt Readings from Last 3 Encounters:  12/12/24 112.7 kg  02/25/24 109.2 kg  12/24/23 113.7 kg     EKG Interpretation Date/Time:  Tuesday December 12 2024 09:08:05 EST Ventricular Rate:  72 PR Interval:  142 QRS Duration:  82 QT Interval:  388 QTC Calculation: 424 R Axis:   -45  Text Interpretation: Normal sinus rhythm Left anterior fascicular block Possible Inferior infarct , age undetermined Abnormal ECG When compared with ECG of 25-Feb-2024 08:42, No significant change was found Confirmed by Jerod Mcquain ([id number]) on 12/12/2024 9:43:07 AM    Echo 06/04/17 demonstrated  - Left ventricle: The cavity size was normal. There was mild    concentric hypertrophy. Systolic function was normal. The    estimated ejection fraction was in the range of 60% to 65%. Wall    motion was normal; there were no regional wall motion    abnormalities. Left ventricular diastolic function parameters    were normal.  - Left atrium: The atrium was normal in size.  - Right ventricle: Systolic function was normal.  - Pulmonary arteries: Systolic pressure was within the normal    range.   Impressions:   - Compared to previous study (was in atrial flutter at the time),    EF has improved to normal.    CHA2DS2-VASc Score = 1  The patient's score is based upon: CHF History: 0 (EF normalized post ablation) HTN History: 1 Diabetes History: 0 Stroke History: 0 Vascular Disease History: 0 Age Score: 0 Gender Score: 0       ASSESSMENT AND PLAN: Paroxysmal Atrial Fibrillation (ICD10:  I48.0) The patient's CHA2DS2-VASc score is 1, indicating a 0.6% annual risk of stroke.   S/p atrial flutter ablation 02/19/17 We discussed rhythm control options including AAD (Multaq  and flecainide) and afib ablation. Long term, patient interested in afib ablation, will refer back to Dr Inocencio to discuss. For now, will start Multaq  400 mg BID  as a bridge to ablation.  Continue diltiazem  240 mg daily Continue Toprol  25 mg BID Continue Eliquis  5 mg BID  OSA  Encouraged nightly CPAP  HTN Stable on current regimen  Obesity Body mass index is 36.68 kg/m.  Encouraged lifestyle modification   Follow up in the AF clinic in 5-7 days for ECG then with Dr Inocencio to discuss ablation.      Daril Kicks PA-C [facility] [address] [place], [place] [address] [phone] "

## 2024-12-19 ENCOUNTER — Telehealth: Payer: Self-pay | Admitting: Cardiovascular Disease

## 2024-12-19 MED ORDER — METOPROLOL SUCCINATE ER 25 MG PO TB24: 25.0000 mg | ORAL_TABLET | Freq: Two times a day (BID) | ORAL | 3 refills | Status: AC

## 2024-12-19 NOTE — Telephone Encounter (Signed)
 Pt c/o medication issue:  1. Name of Medication: metoprolol  succinate (TOPROL -XL) 25 MG 24 hr tablet   2. How are you currently taking this medication (dosage and times per day)? As written  3. Are you having a reaction (difficulty breathing--STAT)? No   4. What is your medication issue? Patient claims he attempted to pick up prescription and pharmacy stated it is too early to pick up a new prescription, patient states pharmacy has the wrong prescription they have 50 mg single dose once a day. Please advise.     The pharmacy : [facility] #[id number] - [place], [place] - [address]   Patient is out of meds

## 2024-12-19 NOTE — Telephone Encounter (Signed)
 Patient is on metoprolol  succinate 25 mg twice daily and metoprolol  tartrate 25 mg as needed. Patient is requesting refill of metoprolol  succinate. Refill has been sent in.

## 2024-12-19 NOTE — Addendum Note (Signed)
 Addended by: MAGDALINE NEEDLE on: 12/19/2024 09:20 AM   Modules accepted: Orders

## 2024-12-20 ENCOUNTER — Ambulatory Visit (HOSPITAL_COMMUNITY)
Admission: RE | Admit: 2024-12-20 | Discharge: 2024-12-20 | Disposition: A | Discharge status: Home / Self Care | Source: Ambulatory Visit | Attending: Physician Assistant | Admitting: Physician Assistant

## 2024-12-20 VITALS — HR 66

## 2024-12-20 DIAGNOSIS — I4891 Unspecified atrial fibrillation: Secondary | ICD-10-CM

## 2024-12-20 NOTE — Patient Instructions (Signed)
 Scheduling Cassie [phone]

## 2024-12-20 NOTE — Progress Notes (Signed)
 Patient returns for ECG after starting Multaq . ECG shows:   EKG Interpretation Date/Time:  Wednesday December 20 2024 08:30:43 EST Ventricular Rate:  66 PR Interval:  140 QRS Duration:  82 QT Interval:  404 QTC Calculation: 423 R Axis:   -38  Text Interpretation: Normal sinus rhythm Left axis deviation Possible Inferior infarct (cited on or before 12-Dec-2024) Abnormal ECG When compared with ECG of 12-Dec-2024 09:08, No significant change was found Confirmed by Debbie Bellucci ([id number]) on 12/20/2024 8:38:35 AM    Continue Multaq  400 mg BID. Follow up with Dr Inocencio to discuss ablation.

## 2025-02-05 ENCOUNTER — Ambulatory Visit: Admitting: Cardiology

## 2025-02-12 ENCOUNTER — Ambulatory Visit: Admitting: Cardiovascular Disease
